# Patient Record
Sex: Female | Born: 1965 | Race: White | Hispanic: No | Smoking: Never smoker
Health system: Southern US, Community
[De-identification: ages and names within clinical notes are randomized; demographics above are authoritative.]

## PROBLEM LIST (undated history)

## (undated) DIAGNOSIS — Z87898 Personal history of other specified conditions: Secondary | ICD-10-CM

## (undated) DIAGNOSIS — N926 Irregular menstruation, unspecified: Secondary | ICD-10-CM

## (undated) DIAGNOSIS — R51 Headache: Secondary | ICD-10-CM

## (undated) DIAGNOSIS — F329 Major depressive disorder, single episode, unspecified: Secondary | ICD-10-CM

## (undated) DIAGNOSIS — S0300XA Dislocation of jaw, unspecified side, initial encounter: Secondary | ICD-10-CM

## (undated) DIAGNOSIS — N920 Excessive and frequent menstruation with regular cycle: Secondary | ICD-10-CM

## (undated) DIAGNOSIS — E785 Hyperlipidemia, unspecified: Secondary | ICD-10-CM

## (undated) DIAGNOSIS — R7303 Prediabetes: Secondary | ICD-10-CM

## (undated) DIAGNOSIS — M1990 Unspecified osteoarthritis, unspecified site: Secondary | ICD-10-CM

## (undated) DIAGNOSIS — H9190 Unspecified hearing loss, unspecified ear: Secondary | ICD-10-CM

## (undated) DIAGNOSIS — I872 Venous insufficiency (chronic) (peripheral): Secondary | ICD-10-CM

## (undated) DIAGNOSIS — F419 Anxiety disorder, unspecified: Secondary | ICD-10-CM

## (undated) DIAGNOSIS — I10 Essential (primary) hypertension: Secondary | ICD-10-CM

## (undated) DIAGNOSIS — R002 Palpitations: Secondary | ICD-10-CM

## (undated) DIAGNOSIS — G4700 Insomnia, unspecified: Secondary | ICD-10-CM

## (undated) DIAGNOSIS — F32A Depression, unspecified: Secondary | ICD-10-CM

## (undated) DIAGNOSIS — R609 Edema, unspecified: Secondary | ICD-10-CM

## (undated) DIAGNOSIS — R0600 Dyspnea, unspecified: Secondary | ICD-10-CM

## (undated) DIAGNOSIS — F319 Bipolar disorder, unspecified: Secondary | ICD-10-CM

## (undated) DIAGNOSIS — Z973 Presence of spectacles and contact lenses: Secondary | ICD-10-CM

## (undated) DIAGNOSIS — R1310 Dysphagia, unspecified: Secondary | ICD-10-CM

## (undated) DIAGNOSIS — I2699 Other pulmonary embolism without acute cor pulmonale: Secondary | ICD-10-CM

## (undated) DIAGNOSIS — I639 Cerebral infarction, unspecified: Secondary | ICD-10-CM

## (undated) DIAGNOSIS — R519 Headache, unspecified: Secondary | ICD-10-CM

## (undated) DIAGNOSIS — I499 Cardiac arrhythmia, unspecified: Secondary | ICD-10-CM

## (undated) DIAGNOSIS — F4310 Post-traumatic stress disorder, unspecified: Secondary | ICD-10-CM

## (undated) DIAGNOSIS — D6851 Activated protein C resistance: Secondary | ICD-10-CM

## (undated) DIAGNOSIS — R000 Tachycardia, unspecified: Secondary | ICD-10-CM

## (undated) DIAGNOSIS — I82409 Acute embolism and thrombosis of unspecified deep veins of unspecified lower extremity: Secondary | ICD-10-CM

## (undated) DIAGNOSIS — R32 Unspecified urinary incontinence: Secondary | ICD-10-CM

## (undated) DIAGNOSIS — K219 Gastro-esophageal reflux disease without esophagitis: Secondary | ICD-10-CM

## (undated) DIAGNOSIS — Z8719 Personal history of other diseases of the digestive system: Secondary | ICD-10-CM

## (undated) DIAGNOSIS — H7292 Unspecified perforation of tympanic membrane, left ear: Secondary | ICD-10-CM

## (undated) DIAGNOSIS — M352 Behcet's disease: Secondary | ICD-10-CM

## (undated) DIAGNOSIS — R61 Generalized hyperhidrosis: Secondary | ICD-10-CM

## (undated) DIAGNOSIS — Z87820 Personal history of traumatic brain injury: Secondary | ICD-10-CM

## (undated) HISTORY — PX: CHOLECYSTECTOMY: SHX55

## (undated) HISTORY — PX: EXPLORATORY LAPAROTOMY: SUR591

---

## 1998-06-05 ENCOUNTER — Ambulatory Visit (HOSPITAL_COMMUNITY): Admission: RE | Admit: 1998-06-05 | Discharge: 1998-06-05 | Payer: Self-pay | Admitting: Family Medicine

## 1998-06-05 ENCOUNTER — Encounter: Payer: Self-pay | Admitting: Family Medicine

## 1998-07-20 ENCOUNTER — Other Ambulatory Visit: Admission: RE | Admit: 1998-07-20 | Discharge: 1998-07-20 | Payer: Self-pay | Admitting: Family Medicine

## 1998-10-26 ENCOUNTER — Ambulatory Visit (HOSPITAL_COMMUNITY): Admission: RE | Admit: 1998-10-26 | Discharge: 1998-10-26 | Payer: Self-pay | Admitting: Obstetrics and Gynecology

## 1999-10-02 ENCOUNTER — Other Ambulatory Visit: Admission: RE | Admit: 1999-10-02 | Discharge: 1999-10-02 | Payer: Self-pay | Admitting: Obstetrics and Gynecology

## 1999-10-03 ENCOUNTER — Other Ambulatory Visit: Admission: RE | Admit: 1999-10-03 | Discharge: 1999-10-03 | Payer: Self-pay | Admitting: Obstetrics and Gynecology

## 1999-10-24 ENCOUNTER — Other Ambulatory Visit (HOSPITAL_COMMUNITY): Admission: RE | Admit: 1999-10-24 | Discharge: 1999-10-26 | Payer: Self-pay | Admitting: Psychiatry

## 2000-05-18 ENCOUNTER — Ambulatory Visit (HOSPITAL_COMMUNITY): Admission: RE | Admit: 2000-05-18 | Discharge: 2000-05-18 | Payer: Self-pay | Admitting: Specialist

## 2000-08-19 DIAGNOSIS — I639 Cerebral infarction, unspecified: Secondary | ICD-10-CM

## 2000-08-19 HISTORY — DX: Cerebral infarction, unspecified: I63.9

## 2018-03-19 DIAGNOSIS — I82409 Acute embolism and thrombosis of unspecified deep veins of unspecified lower extremity: Secondary | ICD-10-CM

## 2018-03-19 HISTORY — DX: Acute embolism and thrombosis of unspecified deep veins of unspecified lower extremity: I82.409

## 2018-04-10 DIAGNOSIS — I2699 Other pulmonary embolism without acute cor pulmonale: Secondary | ICD-10-CM

## 2018-04-10 HISTORY — DX: Other pulmonary embolism without acute cor pulmonale: I26.99

## 2018-08-04 ENCOUNTER — Other Ambulatory Visit: Payer: Self-pay | Admitting: Orthopedic Surgery

## 2018-08-06 NOTE — Patient Instructions (Addendum)
Your procedure is scheduled on: Monday, Dec. 23, 2019   Surgery Time:  11:15AM-12:41PM   Report to [facility] Main  Entrance    Report to admitting at 8:45 AM   Call this number if you have problems the morning of surgery [phone]   Do not eat food or drink liquids :After Midnight.   Brush your teeth the morning of surgery.   Do NOT smoke after Midnight   Take these medicines the morning of surgery with A SIP OF WATER: Diltiazem, Mybetriq, Pravastatin                               You may not have any metal on your body including hair pins, jewelry, and body piercings             Do not wear make-up, lotions, powders, perfumes/cologne, or deodorant             Do not wear nail polish.  Do not shave  48 hours prior to surgery.               Do not bring valuables to the hospital. [facility] IS NOT             RESPONSIBLE   FOR VALUABLES.   Contacts, dentures or bridgework may not be worn into surgery.   Leave suitcase in the car. After surgery it may be brought to your room.   Special Instructions: Bring a copy of your healthcare power of attorney and living will documents         the day of surgery if you haven't scanned them in before.              Please read over the following fact sheets you were given:  [facility] - Preparing for [facility] Before surgery, you can play an important role.  Because skin is not sterile, your skin needs to be as free of germs as possible.  You can reduce the number of germs on your skin by washing with CHG (chlorahexidine gluconate) soap before surgery.  CHG is an antiseptic cleaner which kills germs and bonds with the skin to continue killing germs even after washing. Please DO NOT use if you have an allergy to CHG or antibacterial soaps.  If your skin becomes reddened/irritated stop using the CHG and inform your nurse when you arrive at Short Stay. Do not shave (including legs and underarms) for at least 48 hours prior to the  first CHG shower.  You may shave your face/neck.  Please follow these instructions carefully:  1.  Shower with CHG Soap the night before surgery and the  morning of surgery.  2.  If you choose to wash your hair, wash your hair first as usual with your normal  shampoo.  3.  After you shampoo, rinse your hair and body thoroughly to remove the shampoo.                             4.  Use CHG as you would any other liquid soap.  You can apply chg directly to the skin and wash.  Gently with a scrungie or clean washcloth.  5.  Apply the CHG Soap to your body ONLY FROM THE NECK DOWN.   Do   not use on face/ open  Wound or open sores. Avoid contact with eyes, ears mouth and   genitals (private parts).                       Wash face,  Genitals (private parts) with your normal soap.             6.  Wash thoroughly, paying special attention to the area where your    surgery  will be performed.  7.  Thoroughly rinse your body with warm water from the neck down.  8.  DO NOT shower/wash with your normal soap after using and rinsing off the CHG Soap.                9.  Pat yourself dry with a clean towel.            10.  Wear clean pajamas.            11.  Place clean sheets on your bed the night of your first shower and do not  sleep with pets. Day of Surgery : Do not apply any lotions/deodorants the morning of surgery.  Please wear clean clothes to the hospital/surgery center.  FAILURE TO FOLLOW THESE INSTRUCTIONS MAY RESULT IN THE CANCELLATION OF YOUR SURGERY  PATIENT SIGNATURE_________________________________  NURSE SIGNATURE__________________________________  ________________________________________________________________________   Cathy Murray  An incentive spirometer is a tool that can help keep your lungs clear and active. This tool measures how well you are filling your lungs with each breath. Taking long deep breaths may help reverse or decrease the chance  of developing breathing (pulmonary) problems (especially infection) following:  A long period of time when you are unable to move or be active. BEFORE THE PROCEDURE   If the spirometer includes an indicator to show your best effort, your nurse or respiratory therapist will set it to a desired goal.  If possible, sit up straight or lean slightly forward. Try not to slouch.  Hold the incentive spirometer in an upright position. INSTRUCTIONS FOR USE  1. Sit on the edge of your bed if possible, or sit up as far as you can in bed or on a chair. 2. Hold the incentive spirometer in an upright position. 3. Breathe out normally. 4. Place the mouthpiece in your mouth and seal your lips tightly around it. 5. Breathe in slowly and as deeply as possible, raising the piston or the ball toward the top of the column. 6. Hold your breath for 3-5 seconds or for as long as possible. Allow the piston or ball to fall to the bottom of the column. 7. Remove the mouthpiece from your mouth and breathe out normally. 8. Rest for a few seconds and repeat Steps 1 through 7 at least 10 times every 1-2 hours when you are awake. Take your time and take a few normal breaths between deep breaths. 9. The spirometer may include an indicator to show your best effort. Use the indicator as a goal to work toward during each repetition. 10. After each set of 10 deep breaths, practice coughing to be sure your lungs are clear. If you have an incision (the cut made at the time of surgery), support your incision when coughing by placing a pillow or rolled up towels firmly against it. Once you are able to get out of bed, walk around indoors and cough well. You may stop using the incentive spirometer when instructed by your caregiver.  RISKS AND COMPLICATIONS  Take your time  so you do not get dizzy or light-headed.  If you are in pain, you may need to take or ask for pain medication before doing incentive spirometry. It is harder to  take a deep breath if you are having pain. AFTER USE  Rest and breathe slowly and easily.  It can be helpful to keep track of a log of your progress. Your caregiver can provide you with a simple table to help with this. If you are using the spirometer at home, follow these instructions: [place] IF:   You are having difficultly using the spirometer.  You have trouble using the spirometer as often as instructed.  Your pain medication is not giving enough relief while using the spirometer.  You develop fever of 100.5 F (38.1 C) or higher. SEEK IMMEDIATE MEDICAL CARE IF:   You cough up bloody sputum that had not been present before.  You develop fever of 102 F (38.9 C) or greater.  You develop worsening pain at or near the incision site. MAKE SURE YOU:   Understand these instructions.  Will watch your condition.  Will get help right away if you are not doing well or get worse. Document Released: 12/16/2006 Document Revised: 10/28/2011 Document Reviewed: 02/16/2007 [facility] Patient Information 2014 [place], [place].   ________________________________________________________________________

## 2018-08-07 ENCOUNTER — Encounter (HOSPITAL_COMMUNITY): Payer: Self-pay

## 2018-08-07 ENCOUNTER — Other Ambulatory Visit: Payer: Self-pay

## 2018-08-07 ENCOUNTER — Encounter (HOSPITAL_COMMUNITY)
Admission: RE | Admit: 2018-08-07 | Discharge: 2018-08-07 | Disposition: A | Discharge status: Home / Self Care | Payer: 59 | Source: Ambulatory Visit | Attending: Orthopedic Surgery | Admitting: Orthopedic Surgery

## 2018-08-07 DIAGNOSIS — Z01812 Encounter for preprocedural laboratory examination: Secondary | ICD-10-CM

## 2018-08-07 DIAGNOSIS — Z8673 Personal history of transient ischemic attack (TIA), and cerebral infarction without residual deficits: Secondary | ICD-10-CM | POA: Diagnosis not present

## 2018-08-07 DIAGNOSIS — Z86711 Personal history of pulmonary embolism: Secondary | ICD-10-CM | POA: Diagnosis not present

## 2018-08-07 DIAGNOSIS — Z888 Allergy status to other drugs, medicaments and biological substances status: Secondary | ICD-10-CM | POA: Diagnosis not present

## 2018-08-07 DIAGNOSIS — M1990 Unspecified osteoarthritis, unspecified site: Secondary | ICD-10-CM | POA: Diagnosis not present

## 2018-08-07 DIAGNOSIS — I872 Venous insufficiency (chronic) (peripheral): Secondary | ICD-10-CM | POA: Diagnosis not present

## 2018-08-07 DIAGNOSIS — Z9049 Acquired absence of other specified parts of digestive tract: Secondary | ICD-10-CM | POA: Diagnosis not present

## 2018-08-07 DIAGNOSIS — M7541 Impingement syndrome of right shoulder: Secondary | ICD-10-CM | POA: Diagnosis present

## 2018-08-07 DIAGNOSIS — I10 Essential (primary) hypertension: Secondary | ICD-10-CM | POA: Diagnosis not present

## 2018-08-07 DIAGNOSIS — Z7901 Long term (current) use of anticoagulants: Secondary | ICD-10-CM | POA: Diagnosis not present

## 2018-08-07 DIAGNOSIS — D6851 Activated protein C resistance: Secondary | ICD-10-CM | POA: Diagnosis not present

## 2018-08-07 DIAGNOSIS — F419 Anxiety disorder, unspecified: Secondary | ICD-10-CM | POA: Diagnosis not present

## 2018-08-07 DIAGNOSIS — G4700 Insomnia, unspecified: Secondary | ICD-10-CM | POA: Diagnosis not present

## 2018-08-07 DIAGNOSIS — F4310 Post-traumatic stress disorder, unspecified: Secondary | ICD-10-CM | POA: Diagnosis not present

## 2018-08-07 DIAGNOSIS — Z86718 Personal history of other venous thrombosis and embolism: Secondary | ICD-10-CM | POA: Diagnosis not present

## 2018-08-07 DIAGNOSIS — K219 Gastro-esophageal reflux disease without esophagitis: Secondary | ICD-10-CM | POA: Diagnosis not present

## 2018-08-07 DIAGNOSIS — M352 Behcet's disease: Secondary | ICD-10-CM | POA: Diagnosis not present

## 2018-08-07 DIAGNOSIS — E785 Hyperlipidemia, unspecified: Secondary | ICD-10-CM | POA: Diagnosis not present

## 2018-08-07 DIAGNOSIS — R7303 Prediabetes: Secondary | ICD-10-CM | POA: Diagnosis not present

## 2018-08-07 DIAGNOSIS — Z79899 Other long term (current) drug therapy: Secondary | ICD-10-CM | POA: Diagnosis not present

## 2018-08-07 DIAGNOSIS — F319 Bipolar disorder, unspecified: Secondary | ICD-10-CM | POA: Diagnosis not present

## 2018-08-07 HISTORY — DX: Bipolar disorder, unspecified: F31.9

## 2018-08-07 HISTORY — DX: Unspecified perforation of tympanic membrane, left ear: H72.92

## 2018-08-07 HISTORY — DX: Presence of spectacles and contact lenses: Z97.3

## 2018-08-07 HISTORY — DX: Prediabetes: R73.03

## 2018-08-07 HISTORY — DX: Headache: R51

## 2018-08-07 HISTORY — DX: Dislocation of jaw, unspecified side, initial encounter: S03.00XA

## 2018-08-07 HISTORY — DX: Personal history of other diseases of the digestive system: Z87.19

## 2018-08-07 HISTORY — DX: Dyspnea, unspecified: R06.00

## 2018-08-07 HISTORY — DX: Acute embolism and thrombosis of unspecified deep veins of unspecified lower extremity: I82.409

## 2018-08-07 HISTORY — DX: Essential (primary) hypertension: I10

## 2018-08-07 HISTORY — DX: Unspecified hearing loss, unspecified ear: H91.90

## 2018-08-07 HISTORY — DX: Hyperlipidemia, unspecified: E78.5

## 2018-08-07 HISTORY — DX: Depression, unspecified: F32.A

## 2018-08-07 HISTORY — DX: Venous insufficiency (chronic) (peripheral): I87.2

## 2018-08-07 HISTORY — DX: Activated protein C resistance: D68.51

## 2018-08-07 HISTORY — DX: Major depressive disorder, single episode, unspecified: F32.9

## 2018-08-07 HISTORY — DX: Headache, unspecified: R51.9

## 2018-08-07 HISTORY — DX: Unspecified osteoarthritis, unspecified site: M19.90

## 2018-08-07 HISTORY — DX: Cerebral infarction, unspecified: I63.9

## 2018-08-07 HISTORY — DX: Personal history of traumatic brain injury: Z87.820

## 2018-08-07 HISTORY — DX: Tachycardia, unspecified: R00.0

## 2018-08-07 HISTORY — DX: Post-traumatic stress disorder, unspecified: F43.10

## 2018-08-07 HISTORY — DX: Generalized hyperhidrosis: R61

## 2018-08-07 HISTORY — DX: Anxiety disorder, unspecified: F41.9

## 2018-08-07 HISTORY — DX: Dysphagia, unspecified: R13.10

## 2018-08-07 HISTORY — DX: Palpitations: R00.2

## 2018-08-07 HISTORY — DX: Unspecified urinary incontinence: R32

## 2018-08-07 HISTORY — DX: Behcet's disease: M35.2

## 2018-08-07 HISTORY — DX: Irregular menstruation, unspecified: N92.6

## 2018-08-07 HISTORY — DX: Edema, unspecified: R60.9

## 2018-08-07 HISTORY — DX: Personal history of other specified conditions: Z87.898

## 2018-08-07 HISTORY — DX: Gastro-esophageal reflux disease without esophagitis: K21.9

## 2018-08-07 HISTORY — DX: Excessive and frequent menstruation with regular cycle: N92.0

## 2018-08-07 HISTORY — DX: Other pulmonary embolism without acute cor pulmonale: I26.99

## 2018-08-07 HISTORY — DX: Insomnia, unspecified: G47.00

## 2018-08-07 LAB — BASIC METABOLIC PANEL
Anion gap: 8 (ref 5–15)
BUN: 17 mg/dL (ref 6–20)
CO2: 25 mmol/L (ref 22–32)
Calcium: 8.8 mg/dL — ABNORMAL LOW (ref 8.9–10.3)
Chloride: 105 mmol/L (ref 98–111)
Creatinine, Ser: 0.84 mg/dL (ref 0.44–1.00)
GFR calc Af Amer: >60 mL/min (ref >60)
GFR calc non Af Amer: >60 mL/min (ref >60)
GLUCOSE: 106 mg/dL — AB (ref 70–99)
Potassium: 4.3 mmol/L (ref 3.5–5.1)
Sodium: 138 mmol/L (ref 135–145)

## 2018-08-07 LAB — CBC
HCT: 36.7 % (ref 36.0–46.0)
Hemoglobin: 11.4 g/dL — ABNORMAL LOW (ref 12.0–15.0)
MCH: 28.4 pg (ref 26.0–34.0)
MCHC: 31.1 g/dL (ref 30.0–36.0)
MCV: 91.5 fL (ref 80.0–100.0)
Platelets: 290 10*3/uL (ref 150–400)
RBC: 4.01 MIL/uL (ref 3.87–5.11)
RDW: 12.6 % (ref 11.5–15.5)
WBC: 6.2 10*3/uL (ref 4.0–10.5)
nRBC: 0 % (ref 0.0–0.2)

## 2018-08-07 LAB — HEMOGLOBIN A1C
Hgb A1c MFr Bld: 5.8 % — ABNORMAL HIGH (ref 4.8–5.6)
Mean Plasma Glucose: 119.76 mg/dL

## 2018-08-07 NOTE — Pre-Procedure Instructions (Signed)
The following are in the hard chart:  Last office visit note 07/14/2018 A. Dein P.A. EKG 05/17/2018 ECHO report 04/11/2018 and 06/13/2017

## 2018-08-07 NOTE — Pre-Procedure Instructions (Signed)
CBC and BMP results 08/07/2018 sent to Dr. Sherlean FootLucey via epic

## 2018-08-10 ENCOUNTER — Encounter (HOSPITAL_COMMUNITY): Payer: Self-pay | Admitting: Emergency Medicine

## 2018-08-10 ENCOUNTER — Ambulatory Visit (HOSPITAL_COMMUNITY): Payer: 59 | Admitting: Certified Registered Nurse Anesthetist

## 2018-08-10 ENCOUNTER — Ambulatory Visit (HOSPITAL_COMMUNITY)
Admission: RE | Admit: 2018-08-10 | Discharge: 2018-08-10 | Disposition: A | Discharge status: Home / Self Care | Payer: 59 | Source: Ambulatory Visit | Attending: Orthopedic Surgery | Admitting: Orthopedic Surgery

## 2018-08-10 ENCOUNTER — Ambulatory Visit (HOSPITAL_COMMUNITY): Payer: 59

## 2018-08-10 ENCOUNTER — Encounter (HOSPITAL_COMMUNITY)
Admission: RE | Disposition: A | Discharge status: Home / Self Care | Payer: Self-pay | Source: Ambulatory Visit | Attending: Orthopedic Surgery

## 2018-08-10 DIAGNOSIS — Z8673 Personal history of transient ischemic attack (TIA), and cerebral infarction without residual deficits: Secondary | ICD-10-CM | POA: Insufficient documentation

## 2018-08-10 DIAGNOSIS — I872 Venous insufficiency (chronic) (peripheral): Secondary | ICD-10-CM | POA: Insufficient documentation

## 2018-08-10 DIAGNOSIS — R0602 Shortness of breath: Secondary | ICD-10-CM

## 2018-08-10 DIAGNOSIS — Z888 Allergy status to other drugs, medicaments and biological substances status: Secondary | ICD-10-CM | POA: Insufficient documentation

## 2018-08-10 DIAGNOSIS — Z79899 Other long term (current) drug therapy: Secondary | ICD-10-CM | POA: Insufficient documentation

## 2018-08-10 DIAGNOSIS — D6851 Activated protein C resistance: Secondary | ICD-10-CM | POA: Insufficient documentation

## 2018-08-10 DIAGNOSIS — Z9049 Acquired absence of other specified parts of digestive tract: Secondary | ICD-10-CM | POA: Insufficient documentation

## 2018-08-10 DIAGNOSIS — Z7901 Long term (current) use of anticoagulants: Secondary | ICD-10-CM | POA: Insufficient documentation

## 2018-08-10 DIAGNOSIS — F319 Bipolar disorder, unspecified: Secondary | ICD-10-CM | POA: Insufficient documentation

## 2018-08-10 DIAGNOSIS — M352 Behcet's disease: Secondary | ICD-10-CM | POA: Insufficient documentation

## 2018-08-10 DIAGNOSIS — Z86711 Personal history of pulmonary embolism: Secondary | ICD-10-CM | POA: Insufficient documentation

## 2018-08-10 DIAGNOSIS — I10 Essential (primary) hypertension: Secondary | ICD-10-CM | POA: Insufficient documentation

## 2018-08-10 DIAGNOSIS — M7541 Impingement syndrome of right shoulder: Secondary | ICD-10-CM | POA: Insufficient documentation

## 2018-08-10 DIAGNOSIS — K219 Gastro-esophageal reflux disease without esophagitis: Secondary | ICD-10-CM | POA: Insufficient documentation

## 2018-08-10 DIAGNOSIS — F4310 Post-traumatic stress disorder, unspecified: Secondary | ICD-10-CM | POA: Insufficient documentation

## 2018-08-10 DIAGNOSIS — E785 Hyperlipidemia, unspecified: Secondary | ICD-10-CM | POA: Insufficient documentation

## 2018-08-10 DIAGNOSIS — M1990 Unspecified osteoarthritis, unspecified site: Secondary | ICD-10-CM | POA: Insufficient documentation

## 2018-08-10 DIAGNOSIS — R7303 Prediabetes: Secondary | ICD-10-CM | POA: Insufficient documentation

## 2018-08-10 DIAGNOSIS — G4700 Insomnia, unspecified: Secondary | ICD-10-CM | POA: Insufficient documentation

## 2018-08-10 DIAGNOSIS — F419 Anxiety disorder, unspecified: Secondary | ICD-10-CM | POA: Insufficient documentation

## 2018-08-10 DIAGNOSIS — Z86718 Personal history of other venous thrombosis and embolism: Secondary | ICD-10-CM | POA: Insufficient documentation

## 2018-08-10 HISTORY — PX: EXAM UNDER ANESTHESIA WITH MANIPULATION OF SHOULDER: SHX5817

## 2018-08-10 LAB — PREGNANCY, URINE: PREG TEST UR: NEGATIVE

## 2018-08-10 SURGERY — EXAM UNDER ANESTHESIA, SHOULDER, WITH MANIPULATION
Anesthesia: Regional | Site: Shoulder | Laterality: Right

## 2018-08-10 MED ORDER — FENTANYL CITRATE (PF) 250 MCG/5ML IJ SOLN
INTRAMUSCULAR | Status: AC
  Filled 2018-08-10: qty 5

## 2018-08-10 MED ORDER — BUPIVACAINE LIPOSOME 1.3 % IJ SUSP
INTRAMUSCULAR | Status: DC | PRN
  Administered 2018-08-10: 10 mL via PERINEURAL

## 2018-08-10 MED ORDER — OXYCODONE HCL 5 MG PO TABS: 5.0000 mg | ORAL_TABLET | ORAL | 0 refills | Status: DC | PRN

## 2018-08-10 MED ORDER — ROCURONIUM BROMIDE 100 MG/10ML IV SOLN
INTRAVENOUS | Status: DC | PRN
  Administered 2018-08-10: 20 mg via INTRAVENOUS
  Administered 2018-08-10: 50 mg via INTRAVENOUS

## 2018-08-10 MED ORDER — DEXAMETHASONE SODIUM PHOSPHATE 10 MG/ML IJ SOLN
INTRAMUSCULAR | Status: DC | PRN
  Administered 2018-08-10: 10 mg via INTRAVENOUS

## 2018-08-10 MED ORDER — ONDANSETRON HCL 4 MG/2ML IJ SOLN
INTRAMUSCULAR | Status: AC
  Filled 2018-08-10: qty 2

## 2018-08-10 MED ORDER — MIDAZOLAM HCL 2 MG/2ML IJ SOLN
INTRAMUSCULAR | Status: AC
  Filled 2018-08-10: qty 2

## 2018-08-10 MED ORDER — LIDOCAINE HCL (CARDIAC) PF 100 MG/5ML IV SOSY
PREFILLED_SYRINGE | INTRAVENOUS | Status: DC | PRN
  Administered 2018-08-10: 40 mg via INTRAVENOUS

## 2018-08-10 MED ORDER — MIDAZOLAM HCL 5 MG/5ML IJ SOLN
INTRAMUSCULAR | Status: DC | PRN
  Administered 2018-08-10 (×2): 1 mg via INTRAVENOUS

## 2018-08-10 MED ORDER — PROPOFOL 10 MG/ML IV BOLUS
INTRAVENOUS | Status: AC
  Filled 2018-08-10: qty 20

## 2018-08-10 MED ORDER — BUPIVACAINE HCL (PF) 0.5 % IJ SOLN
INTRAMUSCULAR | Status: DC | PRN
  Administered 2018-08-10: 15 mL via PERINEURAL

## 2018-08-10 MED ORDER — SUGAMMADEX SODIUM 500 MG/5ML IV SOLN
INTRAVENOUS | Status: AC
  Filled 2018-08-10: qty 5

## 2018-08-10 MED ORDER — PROPOFOL 10 MG/ML IV BOLUS
INTRAVENOUS | Status: DC | PRN
  Administered 2018-08-10: 150 mg via INTRAVENOUS
  Administered 2018-08-10: 25 mg via INTRAVENOUS

## 2018-08-10 MED ORDER — STERILE WATER FOR INJECTION IJ SOLN
INTRAMUSCULAR | Status: DC | PRN
  Administered 2018-08-10: 500 mL

## 2018-08-10 MED ORDER — FENTANYL CITRATE (PF) 100 MCG/2ML IJ SOLN: 25.0000 ug | INTRAMUSCULAR | Status: DC | PRN

## 2018-08-10 MED ORDER — FENTANYL CITRATE (PF) 100 MCG/2ML IJ SOLN
INTRAMUSCULAR | Status: DC | PRN
  Administered 2018-08-10: 50 ug via INTRAVENOUS

## 2018-08-10 MED ORDER — MIDAZOLAM HCL 2 MG/2ML IJ SOLN
1.0000 mg | INTRAMUSCULAR | Status: DC
  Administered 2018-08-10: 2 mg via INTRAVENOUS
  Filled 2018-08-10: qty 2

## 2018-08-10 MED ORDER — LACTATED RINGERS IV SOLN
INTRAVENOUS | Status: DC
  Administered 2018-08-10 (×2): via INTRAVENOUS

## 2018-08-10 MED ORDER — FENTANYL CITRATE (PF) 100 MCG/2ML IJ SOLN
50.0000 ug | INTRAMUSCULAR | Status: DC
  Filled 2018-08-10: qty 2

## 2018-08-10 MED ORDER — ONDANSETRON HCL 4 MG/2ML IJ SOLN
INTRAMUSCULAR | Status: DC | PRN
  Administered 2018-08-10: 4 mg via INTRAVENOUS

## 2018-08-10 MED ORDER — CHLORHEXIDINE GLUCONATE 4 % EX LIQD: 60.0000 mL | Freq: Once | CUTANEOUS | Status: DC

## 2018-08-10 MED ORDER — SUGAMMADEX SODIUM 500 MG/5ML IV SOLN
INTRAVENOUS | Status: DC | PRN
  Administered 2018-08-10: 300 mg via INTRAVENOUS

## 2018-08-10 MED ORDER — PHENYLEPHRINE HCL 10 MG/ML IJ SOLN
INTRAMUSCULAR | Status: DC | PRN
  Administered 2018-08-10: 40 ug via INTRAVENOUS
  Administered 2018-08-10: 120 ug via INTRAVENOUS
  Administered 2018-08-10: 40 ug via INTRAVENOUS
  Administered 2018-08-10: 80 ug via INTRAVENOUS

## 2018-08-10 SURGICAL SUPPLY — 24 items
BLADE GREAT WHITE 4.2 (BLADE) ×1 IMPLANT
BLADE SURG SZ11 CARB STEEL (BLADE) ×1 IMPLANT
CANNULA 5.75X7 CRYSTAL CLEAR (CANNULA) ×1 IMPLANT
COVER SURGICAL LIGHT HANDLE (MISCELLANEOUS) ×3 IMPLANT
COVER WAND RF STERILE (DRAPES) ×1 IMPLANT
DRAPE SHOULDER BEACH CHAIR (DRAPES) ×3 IMPLANT
DRSG PAD ABDOMINAL 8X10 ST (GAUZE/BANDAGES/DRESSINGS) ×3 IMPLANT
GAUZE XEROFORM 1X8 LF (GAUZE/BANDAGES/DRESSINGS) ×2 IMPLANT
GLOVE BIOGEL PI IND STRL 7.5 (GLOVE) ×3 IMPLANT
GLOVE BIOGEL PI IND STRL 8 (GLOVE) ×2 IMPLANT
GLOVE BIOGEL PI INDICATOR 7.5 (GLOVE) ×3
GLOVE BIOGEL PI INDICATOR 8 (GLOVE) ×2
GLOVE SURG ORTHO 8.0 STRL STRW (GLOVE) ×4 IMPLANT
GOWN STRL REUS W/TWL XL LVL3 (GOWN DISPOSABLE) ×4 IMPLANT
KIT BASIN OR (CUSTOM PROCEDURE TRAY) ×1 IMPLANT
MANIFOLD NEPTUNE II (INSTRUMENTS) ×3 IMPLANT
PACK SHOULDER (CUSTOM PROCEDURE TRAY) ×1 IMPLANT
PROBE BIPOLAR ATHRO 135MM 90D (MISCELLANEOUS) ×1 IMPLANT
PROTECTOR NERVE ULNAR (MISCELLANEOUS) ×3 IMPLANT
SLING ARM XL FOAM STRAP (SOFTGOODS) ×2 IMPLANT
SUT ETHILON 4 0 (SUTURE) ×1 IMPLANT
TAPE CLOTH SURG 6X10 WHT LF (GAUZE/BANDAGES/DRESSINGS) ×1 IMPLANT
TOWEL OR 17X26 10 PK STRL BLUE (TOWEL DISPOSABLE) ×1 IMPLANT
TUBING ARTHRO INFLOW-ONLY STRL (TUBING) ×2 IMPLANT

## 2018-08-10 NOTE — Anesthesia Procedure Notes (Signed)
Procedure Name: Intubation Performed by: Freddrick March, MD Pre-anesthesia Checklist: Patient identified, Emergency Drugs available, Suction available, Patient being monitored and Timeout performed Patient Re-evaluated:Patient Re-evaluated prior to induction Oxygen Delivery Method: Circle system utilized Preoxygenation: Pre-oxygenation with 100% oxygen Induction Type: IV induction Ventilation: Mask ventilation without difficulty Laryngoscope Size: Glidescope and 4 Grade View: Grade IV Tube type: Oral Tube size: 7.0 mm Number of attempts: 1 Airway Equipment and Method: Stylet,  Oral airway and Video-laryngoscopy Placement Confirmation: ETT inserted through vocal cords under direct vision,  positive ETCO2 and breath sounds checked- equal and bilateral Secured at: 22 cm Tube secured with: Tape Dental Injury: Teeth and Oropharynx as per pre-operative assessment  Difficulty Due To: Difficulty was anticipated, Difficult Airway- due to large tongue, Difficult Airway- due to reduced neck mobility and Difficult Airway- due to limited oral opening Future Recommendations: Recommend- induction with short-acting agent, and alternative techniques readily available Comments: First look plain mac 3  Unable to visualize cords rigid larynx, poor opening.  Second attempt glidescope, intubated but cuff ruptured on teeth, reintubated over tube changer

## 2018-08-10 NOTE — Progress Notes (Signed)
Assisted Dr. Woodrum with right, ultrasound guided, adductor canal block. Side rails up, monitors on throughout procedure. See vital signs in flow sheet. Tolerated Procedure well.  

## 2018-08-10 NOTE — H&P (Signed)
Cathy MinersLeslie C Stofer MRN:  [id number] DOB/SEX:  09-24-1965/female  CHIEF COMPLAINT:  Painful right shoulder  HISTORY: Patient is a 52 y.o. female presented with a history of pain in the right shoulder. Onset of symptoms was gradual starting a few months ago with gradually worsening course since that time. Patient has been treated conservatively with over-the-counter NSAIDs and activity modification. Patient currently rates pain in the shoulder at 10 out of 10 with activity. There is pain at night.  PAST MEDICAL HISTORY: There are no active problems to display for this patient.  Past Medical History:  Diagnosis Date  . Anxiety   . Behcet's disease (HCC)   . Bipolar disorder (HCC)   . Dependent edema    Legs and feet  . Depression   . DVT (deep venous thrombosis) (HCC) 03/2018   Left leg  . Dyspnea    with exertion  . Factor V Leiden mutation (HCC)   . GERD (gastroesophageal reflux disease)   . Headache    Generalized  . Hearing loss   . Heart palpitations   . Heavy menstrual bleeding   . History of concussion   . History of dizziness   . History of gallstones   . Hyperlipidemia   . Hypertension   . Insomnia   . Irregular uterine bleeding   . Night sweats   . OA (osteoarthritis)   . PE (pulmonary thromboembolism) (HCC) 04/10/2018  . Pre-diabetes   . PTSD (post-traumatic stress disorder)   . Ruptured ear drum, left   . Sinus tachycardia   . Stroke [facility](HCC) 2002   balance and short term memory issues   . TMJ (dislocation of temporomandibular joint)   . Trouble swallowing   . Urine incontinence   . Venous insufficiency   . Wears glasses    Past Surgical History:  Procedure Laterality Date  . CESAREAN SECTION    . CHOLECYSTECTOMY    . EXPLORATORY LAPAROTOMY  1990's     MEDICATIONS:   Medications Prior to Admission  Medication Sig Dispense Refill Last Dose  . diltiazem (DILACOR XR) 120 MG 24 hr capsule Take 120 mg by mouth daily.     . lansoprazole (PREVACID) 30 MG  capsule Take 30 mg by mouth daily at 12 noon.     . mirabegron ER (MYRBETRIQ) 50 MG TB24 tablet Take 50 mg by mouth daily.     Marland Kitchen. OVER THE COUNTER MEDICATION Take 2 capsules by mouth daily. Amberen: menopause relief     . pravastatin (PRAVACHOL) 40 MG tablet Take 40 mg by mouth daily.     . rivaroxaban (XARELTO) 20 MG TABS tablet Take 20 mg by mouth daily with supper.     . ziprasidone (GEODON) 40 MG capsule Take 40 mg by mouth daily.       ALLERGIES:   Allergies  Allergen Reactions  . Adhesive [Tape] Itching    REVIEW OF SYSTEMS:  A comprehensive review of systems was negative except for: Musculoskeletal: positive for bone pain and muscle weakness   FAMILY HISTORY:  No family history on file.  SOCIAL HISTORY:   Social History   Tobacco Use  . Smoking status: Never Smoker  . Smokeless tobacco: Never Used  Substance Use Topics  . Alcohol use: Yes    Frequency: Never    Comment: rare     EXAMINATION:  Vital signs in last 24 hours: Temp:  [97.3 F (36.3 C)] 97.3 F (36.3 C) (12/23 0858) Pulse Rate:  [86] 86 (12/23 0858)  Resp:  [14] 14 (12/23 0858) BP: (128)/(83) 128/83 (12/23 0858) SpO2:  [99 %] 99 % (12/23 0858)  BP 128/83   Pulse 86   Temp (!) 97.3 F (36.3 C) (Oral)   Resp 14   LMP 08/05/2018 (Exact Date)   SpO2 99%   General Appearance:    Alert, cooperative, no distress, appears stated age  Head:    Normocephalic, without obvious abnormality, atraumatic  Eyes:    PERRL, conjunctiva/corneas clear, EOM's intact, fundi    benign, both eyes  Ears:    Normal TM's and external ear canals, both ears  Nose:   Nares normal, septum midline, mucosa normal, no drainage    or sinus tenderness  Throat:   Lips, mucosa, and tongue normal; teeth and gums normal  Neck:   Supple, symmetrical, trachea midline, no adenopathy;    thyroid:  no enlargement/tenderness/nodules; no carotid   bruit or JVD  Back:     Symmetric, no curvature, ROM normal, no CVA tenderness  Lungs:      Clear to auscultation bilaterally, respirations unlabored  Chest Wall:    No tenderness or deformity   Heart:    Regular rate and rhythm, S1 and S2 normal, no murmur, rub   or gallop  Breast Exam:    No tenderness, masses, or nipple abnormality  Abdomen:     Soft, non-tender, bowel sounds active all four quadrants,    no masses, no organomegaly  Genitalia:    Normal female without lesion, discharge or tenderness  Rectal:    Normal tone, no masses or tenderness;   guaiac negative stool  Extremities:   Extremities normal, atraumatic, no cyanosis or edema  Pulses:   2+ and symmetric all extremities  Skin:   Skin color, texture, turgor normal, no rashes or lesions  Lymph nodes:   Cervical, supraclavicular, and axillary nodes normal  Neurologic:   CNII-XII intact, normal strength, sensation and reflexes    throughout    Musculoskeletal:  ROM decreased, Ligaments intact,  Imaging Review Plain radiographs demonstrate mild degenerative joint disease of the right shoulder. The overall alignment is neutral. The bone quality appears to be good for age and reported activity level.  Assessment/Plan: Impingement syndrome right shoulder, adhesive capsulitis   The patient history, physical examination and imaging studies are consistent with impingement and adhesive capsulitis of the right shoulder. The patient has failed conservative treatment.  The clearance notes were reviewed.  After discussion with the patient it was felt that shoulder arthroscopy with manipulation was indicated. The procedure,  risks, and benefits of the procedure were presented and reviewed. The patient acknowledged the explanation, agreed to proceed with the plan.  Guy SandiferColby Alan Robbins 08/10/2018, 9:16 AM

## 2018-08-10 NOTE — Anesthesia Preprocedure Evaluation (Addendum)
Anesthesia Evaluation  Patient identified by MRN, date of birth, ID band Patient awake    Reviewed: Allergy & Precautions, NPO status , Patient's Chart, lab work & pertinent test results  Airway Mallampati: III  TM Distance: >3 FB Neck ROM: Full  Mouth opening: Limited Mouth Opening  Dental no notable dental hx. (+) Teeth Intact, Dental Advisory Given   Pulmonary sleep apnea (has not has sleep study yet) , PE   Pulmonary exam normal breath sounds clear to auscultation       Cardiovascular hypertension, negative cardio ROS Normal cardiovascular exam Rhythm:Regular Rate:Normal     Neuro/Psych PSYCHIATRIC DISORDERS Anxiety Depression Bipolar Disorder CVA (2002), Residual Symptoms    GI/Hepatic Neg liver ROS, GERD  Medicated,  Endo/Other  negative endocrine ROS  Renal/GU negative Renal ROS  negative genitourinary   Musculoskeletal  (+) Arthritis , Osteoarthritis,    Abdominal   Peds  Hematology  (+) Blood dyscrasia, , Factor V Leiden   Anesthesia Other Findings Right shoulder impingement syndrome  On xarelto, last dose Friday 08/07/18  Reproductive/Obstetrics                            Anesthesia Physical Anesthesia Plan  ASA: III  Anesthesia Plan: General and Regional   Post-op Pain Management:  Regional for Post-op pain   Induction: Intravenous  PONV Risk Score and Plan: 3 and Ondansetron, Dexamethasone and Midazolam  Airway Management Planned: Oral ETT  Additional Equipment:   Intra-op Plan:   Post-operative Plan: Extubation in OR  Informed Consent: I have reviewed the patients History and Physical, chart, labs and discussed the procedure including the risks, benefits and alternatives for the proposed anesthesia with the patient or authorized representative who has indicated his/her understanding and acceptance.   Dental advisory given  Plan Discussed with: CRNA  Anesthesia  Plan Comments:         Anesthesia Quick Evaluation

## 2018-08-10 NOTE — Anesthesia Postprocedure Evaluation (Signed)
Anesthesia Post Note  Patient: Cathy Murray  Procedure(s) Performed: EXAM UNDER ANESTHESIA WITH MANIPULATION OF SHOULDER (Right ) RIGHT SHOULDER ARTHROSCOPY WITH SUBACROMIAL DECOMPRESSION MANIPULATION (Right Shoulder)     Patient location during evaluation: PACU Anesthesia Type: Regional and General Level of consciousness: awake and alert Pain management: pain level controlled Vital Signs Assessment: post-procedure vital signs reviewed and stable Respiratory status: spontaneous breathing, nonlabored ventilation, respiratory function stable and patient connected to nasal cannula oxygen Cardiovascular status: blood pressure returned to baseline and stable Postop Assessment: no apparent nausea or vomiting Anesthetic complications: no    Last Vitals:  Vitals:   08/10/18 1545 08/10/18 1615  BP: 119/72 131/80  Pulse: 88 85  Resp: 18 17  Temp: 36.6 C 36.6 C  SpO2: 94% 93%    Last Pain:  Vitals:   08/10/18 1615  TempSrc:   PainSc: 0-No pain                 Ezariah Nace L Jassen Sarver

## 2018-08-10 NOTE — Anesthesia Procedure Notes (Signed)
Anesthesia Regional Block: Interscalene brachial plexus block   Pre-Anesthetic Checklist: ,, timeout performed, Correct Patient, Correct Site, Correct Laterality, Correct Procedure, Correct Position, site marked, Risks and benefits discussed,  Surgical consent,  Pre-op evaluation,  At surgeon's request and post-op pain management  Laterality: Right  Prep: Maximum Sterile Barrier Precautions used, chloraprep       Needles:  Injection technique: Single-shot  Needle Type: Echogenic Stimulator Needle     Needle Length: 9cm  Needle Gauge: 22     Additional Needles:   Procedures:,,,, ultrasound used (permanent image in chart),,,,  Narrative:  Start time: 08/10/2018 11:52 AM End time: 08/10/2018 12:02 PM Injection made incrementally with aspirations every 5 mL.  Performed by: Personally  Anesthesiologist: Elmer PickerWoodrum, Vear Staton L, MD  Additional Notes: Monitors applied. No increased pain on injection. No increased resistance to injection. Injection made in 5cc increments. Good needle visualization. Patient tolerated procedure well.

## 2018-08-10 NOTE — Transfer of Care (Signed)
Immediate Anesthesia Transfer of Care Note  Patient: Cathy Murray  Procedure(s) Performed: EXAM UNDER ANESTHESIA WITH MANIPULATION OF SHOULDER (Right ) RIGHT SHOULDER ARTHROSCOPY WITH SUBACROMIAL DECOMPRESSION MANIPULATION (Right Shoulder)  Patient Location: PACU  Anesthesia Type:General  Level of Consciousness: awake, alert , oriented and patient cooperative  Airway & Oxygen Therapy: Patient Spontanous Breathing and Patient connected to face mask oxygen  Post-op Assessment: Report given to RN, Post -op Vital signs reviewed and stable and Patient moving all extremities  Post vital signs: Reviewed and stable  Last Vitals:  Vitals Value Taken Time  BP 87/71 08/10/2018  1:48 PM  Temp 36.4 C 08/10/2018  1:48 PM  Pulse 88 08/10/2018  1:59 PM  Resp 22 08/10/2018  1:59 PM  SpO2 97 % 08/10/2018  1:59 PM  Vitals shown include unvalidated device data.  Last Pain:  Vitals:   08/10/18 1348  TempSrc:   PainSc: 0-No pain      Patients Stated Pain Goal: 4 (08/10/18 [id number])  Complications: No apparent anesthesia complications

## 2018-08-10 NOTE — OR Nursing (Signed)
Patient has a tape allergy. MD notified and requested Medipore tape (REF [id number]) to use for wound dressing.

## 2018-08-11 NOTE — Op Note (Signed)
TOTAL KNEE REPLACEMENT OPERATIVE NOTE:  08/10/2018  6:56 AM  PATIENT:  Cathy Murray  52 y.o. female  PRE-OPERATIVE DIAGNOSIS:  RT. IMPINGEMENT SYNDROME,  POST-OPERATIVE DIAGNOSIS:  RT. IMPINGEMENT SYNDROME  PROCEDURE:  Procedure(s): EXAM UNDER ANESTHESIA WITH MANIPULATION OF SHOULDER RIGHT SHOULDER ARTHROSCOPY WITH SUBACROMIAL DECOMPRESSION MANIPULATION  SURGEON:  Surgeon(s): Dannielle HuhLucey, Alexxander Kurt, MD  PHYSICIAN ASSISTANT: Skip MayerBlair Roberts, PA-C  ANESTHESIA:   regional and general  SPECIMEN: None  COUNTS:  Correct  TOURNIQUET:  * No tourniquets in log *  DICTATION:  Indication for procedure:    The patient is a 52 y.o. female who has failed conservative treatment for RT. IMPINGEMENT SYNDROME,.  Informed consent was obtained prior to anesthesia. The risks versus benefits of the operation were explain and in a way the patient can, and did, understand.   Description of procedure:     The patient was taken to the operating room and placed under anesthesia.  The patient was positioned in the usual fashion taking care that all body parts were adequately padded and/or protected.  A manipulation was carried out.  She was very tight but we regained 95% of normal ROM.  After sterile prep and drape, I made a posterior portal and did a diagnostic arthroscopy.  There was no arthritis.  There was the raw scar tissue which I debrided after making an anterior portal.  There was no other pathology within the glenohumeral joint.  I then redirected the scope into the subacromial space.  I made a direct lateral portal and performed a bursectomy, of which there was a lot.  I then performed a CA ligament release with the arthrocare and cleaned off the undersurface of the acromium and distal clavicle.  I used a 4.250mm cylindrical burr to perform an aggressive acriomioplasty and distal clavicle resection. I removed approximately 1cm of each.  This afforded a lot of space.  I then completed the debridement  of the bursa off the cuff and lavaged and closed with 4-0 nylons in the portals.  I dressed with xeroform, dressing sponges, ABD's and silk tape and applied a sling.  The patient was then awakened, extubated, and taken to the recovery room in stable condition.  COMPLICATIONS:  None.  PLAN OF CARE: Discharge to home after PACU  PATIENT DISPOSITION:  PACU - hemodynamically stable.   Delay start of Pharmacological VTE agent (>24hrs) due to surgical blood loss or risk of bleeding:  not applicable  Please fax a copy of this op note to my office at [phone] (please only include page 1 and 2 of the Case Information op note)

## 2018-08-13 ENCOUNTER — Encounter (HOSPITAL_COMMUNITY): Payer: Self-pay | Admitting: Orthopedic Surgery

## 2019-07-12 ENCOUNTER — Other Ambulatory Visit: Payer: Self-pay | Admitting: Orthopedic Surgery

## 2019-07-13 NOTE — Patient Instructions (Addendum)
DUE TO COVID-19 ONLY ONE VISITOR IS ALLOWED TO COME WITH YOU AND STAY IN THE WAITING ROOM ONLY DURING PRE OP AND PROCEDURE DAY OF SURGERY. THE 1 VISITOR MAY VISIT WITH YOU AFTER SURGERY IN YOUR PRIVATE ROOM DURING VISITING HOURS ONLY!  YOU NEED TO HAVE A COVID 19 TEST ON__11/27_____ :20_____, THIS TEST MUST BE DONE BEFORE SURGERY, COME  [address], [place] , [address].  [facility]) ONCE YOUR COVID TEST IS COMPLETED, PLEASE BEGIN THE QUARANTINE INSTRUCTIONS AS OUTLINED IN YOUR HANDOUT.                [place]   Your procedure is scheduled on: 07/19/19   Report to [facility] Main  Entrance   Report to admitting at   9:24 AM     Call this number if you have problems the morning of surgery [phone]     BRUSH YOUR TEETH MORNING OF SURGERY AND RINSE YOUR MOUTH OUT, NO CHEWING GUM CANDY OR MINTS.   Do not eat food After Midnight.   YOU MAY HAVE CLEAR LIQUIDS FROM MIDNIGHT UNTIL 8:15 AM.    CLEAR LIQUID DIET   Foods Allowed                                                                     Foods Excluded  Coffee and tea, regular and decaf                             liquids that you cannot  Plain Jell-O any favor except red or purple                                           see through such as: Fruit ices (not with fruit pulp)                                     milk, soups, orange juice  Iced Popsicles                                    All solid food Carbonated beverages, regular and diet                                    Cranberry, grape and apple juices Sports drinks like Gatorade Lightly seasoned clear broth or consume(fat free) Sugar, honey syrup      At 8:15 AM Please finish the prescribed Pre-Surgery drink  . Nothing by mouth after you finish the drink !   Take these medicines the morning of surgery with A SIP OF WATER: Gabapentin,Tiadylt ER, Prevacid, Mirabegron              Bring mask and tubing to hospital with  you.  You may not have any metal on your body including hair pins and              piercings  Do not wear jewelry, make-up, lotions, powders or perfumes, deodorant             Do not wear nail polish on your fingernails.              Do not shave  48 hours prior to surgery.                Do not bring valuables to the hospital. [place].  Contacts, dentures or bridgework may not be worn into surgery.      Patients discharged the day of surgery will not be allowed to drive home.   IF YOU ARE HAVING SURGERY AND GOING HOME THE SAME DAY, YOU MUST HAVE AN ADULT TO DRIVE YOU HOME AND BE WITH YOU FOR 24 HOURS.   YOU MAY GO HOME BY TAXI OR UBER OR ORTHERWISE, BUT AN ADULT MUST ACCOMPANY YOU HOME AND STAY WITH YOU FOR 24 HOURS.  Name and phone number of your driver:  Special Instructions: N/A              Please read over the following fact sheets you were given: _____________________________________________________________________             [facility] - Preparing for Surgery  Before surgery, you can play an important role.   Because skin is not sterile, your skin needs to be as free of germs as possible.   You can reduce the number of germs on your skin by washing with CHG (chlorahexidine gluconate) soap before surgery.   CHG is an antiseptic cleaner which kills germs and bonds with the skin to continue killing germs even after washing. Please DO NOT use if you have an allergy to CHG or antibacterial soaps.   If your skin becomes reddened/irritated stop using the CHG and inform your nurse when you arrive at Short Stay. Do not shave (including legs and underarms) for at least 48 hours prior to the first CHG shower.    Please follow these instructions carefully:  1.  Shower with CHG Soap the night before surgery and the  morning of Surgery.  2.  If you choose to wash your hair, wash your hair first as  usual with your  normal  shampoo.  3.  After you shampoo, rinse your hair and body thoroughly to remove the  shampoo.                                        4.  Use CHG as you would any other liquid soap.  You can apply chg directly  to the skin and wash                       Gently with a scrungie or clean washcloth.  5.  Apply the CHG Soap to your body ONLY FROM THE NECK DOWN.   Do not use on face/ open                           Wound or open sores. Avoid contact with eyes, ears mouth and  genitals (private parts).                       Wash face,  Genitals (private parts) with your normal soap.             6.  Wash thoroughly, paying special attention to the area where your surgery  will be performed.  7.  Thoroughly rinse your body with warm water from the neck down.  8.  DO NOT shower/wash with your normal soap after using and rinsing off  the CHG Soap.             9.  Pat yourself dry with a clean towel.            10.  Wear clean pajamas.            11.  Place clean sheets on your bed the night of your first shower and do not  sleep with pets. Day of Surgery : Do not apply any lotions/deodorants the morning of surgery.  Please wear clean clothes to the hospital/surgery center.  FAILURE TO FOLLOW THESE INSTRUCTIONS MAY RESULT IN THE CANCELLATION OF YOUR SURGERY PATIENT SIGNATURE_________________________________  NURSE SIGNATURE__________________________________  ________________________________________________________________________   Rogelia Mire  An incentive spirometer is a tool that can help keep your lungs clear and active. This tool measures how well you are filling your lungs with each breath. Taking long deep breaths may help reverse or decrease the chance of developing breathing (pulmonary) problems (especially infection) following:  A long period of time when you are unable to move or be active. BEFORE THE PROCEDURE   If the spirometer includes an indicator to  show your best effort, your nurse or respiratory therapist will set it to a desired goal.  If possible, sit up straight or lean slightly forward. Try not to slouch.  Hold the incentive spirometer in an upright position. INSTRUCTIONS FOR USE  1. Sit on the edge of your bed if possible, or sit up as far as you can in bed or on a chair. 2. Hold the incentive spirometer in an upright position. 3. Breathe out normally. 4. Place the mouthpiece in your mouth and seal your lips tightly around it. 5. Breathe in slowly and as deeply as possible, raising the piston or the ball toward the top of the column. 6. Hold your breath for 3-5 seconds or for as long as possible. Allow the piston or ball to fall to the bottom of the column. 7. Remove the mouthpiece from your mouth and breathe out normally. 8. Rest for a few seconds and repeat Steps 1 through 7 at least 10 times every 1-2 hours when you are awake. Take your time and take a few normal breaths between deep breaths. 9. The spirometer may include an indicator to show your best effort. Use the indicator as a goal to work toward during each repetition. 10. After each set of 10 deep breaths, practice coughing to be sure your lungs are clear. If you have an incision (the cut made at the time of surgery), support your incision when coughing by placing a pillow or rolled up towels firmly against it. Once you are able to get out of bed, walk around indoors and cough well. You may stop using the incentive spirometer when instructed by your caregiver.  RISKS AND COMPLICATIONS  Take your time so you do not get dizzy or light-headed.  If you are in pain, you may need to take  or ask for pain medication before doing incentive spirometry. It is harder to take a deep breath if you are having pain. AFTER USE  Rest and breathe slowly and easily.  It can be helpful to keep track of a log of your progress. Your caregiver can provide you with a simple table to help with  this. If you are using the spirometer at home, follow these instructions: SEEK MEDICAL CARE IF:   You are having difficultly using the spirometer.  You have trouble using the spirometer as often as instructed.  Your pain medication is not giving enough relief while using the spirometer.  You develop fever of 100.5 F (38.1 C) or higher. SEEK IMMEDIATE MEDICAL CARE IF:   You cough up bloody sputum that had not been present before.  You develop fever of 102 F (38.9 C) or greater.  You develop worsening pain at or near the incision site. MAKE SURE YOU:   Understand these instructions.  Will watch your condition.  Will get help right away if you are not doing well or get worse. Document Released: 12/16/2006 Document Revised: 10/28/2011 Document Reviewed: 02/16/2007 [facility] Patient Information 2014 [place], [place].   ________________________________________________________________________

## 2019-07-14 ENCOUNTER — Encounter (HOSPITAL_COMMUNITY)
Admission: RE | Admit: 2019-07-14 | Discharge: 2019-07-14 | Disposition: A | Discharge status: Home / Self Care | Payer: 59 | Source: Ambulatory Visit | Attending: Orthopedic Surgery | Admitting: Orthopedic Surgery

## 2019-07-14 ENCOUNTER — Other Ambulatory Visit: Payer: Self-pay

## 2019-07-14 ENCOUNTER — Encounter (HOSPITAL_COMMUNITY): Payer: Self-pay

## 2019-07-14 DIAGNOSIS — Z86718 Personal history of other venous thrombosis and embolism: Secondary | ICD-10-CM | POA: Diagnosis not present

## 2019-07-14 DIAGNOSIS — Z7901 Long term (current) use of anticoagulants: Secondary | ICD-10-CM | POA: Diagnosis not present

## 2019-07-14 DIAGNOSIS — I10 Essential (primary) hypertension: Secondary | ICD-10-CM | POA: Insufficient documentation

## 2019-07-14 DIAGNOSIS — I4892 Unspecified atrial flutter: Secondary | ICD-10-CM | POA: Diagnosis not present

## 2019-07-14 DIAGNOSIS — Z9049 Acquired absence of other specified parts of digestive tract: Secondary | ICD-10-CM | POA: Diagnosis not present

## 2019-07-14 DIAGNOSIS — Z86711 Personal history of pulmonary embolism: Secondary | ICD-10-CM | POA: Diagnosis not present

## 2019-07-14 DIAGNOSIS — R7303 Prediabetes: Secondary | ICD-10-CM | POA: Diagnosis not present

## 2019-07-14 DIAGNOSIS — M7502 Adhesive capsulitis of left shoulder: Secondary | ICD-10-CM | POA: Insufficient documentation

## 2019-07-14 DIAGNOSIS — Z8673 Personal history of transient ischemic attack (TIA), and cerebral infarction without residual deficits: Secondary | ICD-10-CM | POA: Diagnosis not present

## 2019-07-14 DIAGNOSIS — M1990 Unspecified osteoarthritis, unspecified site: Secondary | ICD-10-CM | POA: Diagnosis not present

## 2019-07-14 DIAGNOSIS — Z79899 Other long term (current) drug therapy: Secondary | ICD-10-CM | POA: Insufficient documentation

## 2019-07-14 DIAGNOSIS — I4891 Unspecified atrial fibrillation: Secondary | ICD-10-CM | POA: Diagnosis not present

## 2019-07-14 DIAGNOSIS — K219 Gastro-esophageal reflux disease without esophagitis: Secondary | ICD-10-CM | POA: Insufficient documentation

## 2019-07-14 DIAGNOSIS — G4730 Sleep apnea, unspecified: Secondary | ICD-10-CM | POA: Diagnosis not present

## 2019-07-14 DIAGNOSIS — F319 Bipolar disorder, unspecified: Secondary | ICD-10-CM | POA: Diagnosis not present

## 2019-07-14 DIAGNOSIS — F419 Anxiety disorder, unspecified: Secondary | ICD-10-CM | POA: Insufficient documentation

## 2019-07-14 DIAGNOSIS — M25812 Other specified joint disorders, left shoulder: Secondary | ICD-10-CM | POA: Insufficient documentation

## 2019-07-14 DIAGNOSIS — Z01818 Encounter for other preprocedural examination: Secondary | ICD-10-CM | POA: Insufficient documentation

## 2019-07-14 HISTORY — DX: Cardiac arrhythmia, unspecified: I49.9

## 2019-07-14 LAB — CBC
HCT: 41 % (ref 36.0–46.0)
Hemoglobin: 13.3 g/dL (ref 12.0–15.0)
MCH: 30.6 pg (ref 26.0–34.0)
MCHC: 32.4 g/dL (ref 30.0–36.0)
MCV: 94.3 fL (ref 80.0–100.0)
Platelets: 246 10*3/uL (ref 150–400)
RBC: 4.35 MIL/uL (ref 3.87–5.11)
RDW: 12.4 % (ref 11.5–15.5)
WBC: 5.3 10*3/uL (ref 4.0–10.5)
nRBC: 0 % (ref 0.0–0.2)

## 2019-07-14 LAB — BASIC METABOLIC PANEL
Anion gap: 9 (ref 5–15)
BUN: 17 mg/dL (ref 6–20)
CO2: 22 mmol/L (ref 22–32)
Calcium: 9.1 mg/dL (ref 8.9–10.3)
Chloride: 107 mmol/L (ref 98–111)
Creatinine, Ser: 0.77 mg/dL (ref 0.44–1.00)
GFR calc Af Amer: >60 mL/min (ref >60)
GFR calc non Af Amer: >60 mL/min (ref >60)
Glucose, Bld: 108 mg/dL — ABNORMAL HIGH (ref 70–99)
Potassium: 3.9 mmol/L (ref 3.5–5.1)
Sodium: 138 mmol/L (ref 135–145)

## 2019-07-14 LAB — HEMOGLOBIN A1C
Hgb A1c MFr Bld: 5.7 % — ABNORMAL HIGH (ref 4.8–5.6)
Mean Plasma Glucose: 116.89 mg/dL

## 2019-07-14 NOTE — Progress Notes (Addendum)
PCP - dr. Lenna Sciara. [place] Cardiologist - Dr. Wyvonne Lenz  Chest x-ray -  EKG - 07/14/19 Stress Test - 06/29/19 ECHO - no Cardiac Cath - no  Sleep Study - yes CPAP - yes "uses it most of the time"  Fasting Blood Sugar - NA Checks Blood Sugar _____ times a day  Blood Thinner Instructions:Xarelto Aspirin Instructions:stop 3 days prior to DOS bridge with lovinox. Instructions given by Dr. Copper. Pt will see the cardiologist after shoulder surgery. Last Dose:07/16/19  Anesthesia review:   Patient denies shortness of breath, fever, cough and chest pain at PAT appointment yes  Patient verbalized understanding of instructions that were given to them at the PAT appointment. Patient was also instructed that they will need to review over the PAT instructions again at home before surgery. yes

## 2019-07-16 ENCOUNTER — Other Ambulatory Visit (HOSPITAL_COMMUNITY)
Admission: RE | Admit: 2019-07-16 | Discharge: 2019-07-16 | Disposition: A | Discharge status: Home / Self Care | Payer: 59 | Source: Ambulatory Visit | Attending: Orthopedic Surgery | Admitting: Orthopedic Surgery

## 2019-07-16 DIAGNOSIS — Z20828 Contact with and (suspected) exposure to other viral communicable diseases: Secondary | ICD-10-CM | POA: Insufficient documentation

## 2019-07-16 DIAGNOSIS — Z01812 Encounter for preprocedural laboratory examination: Secondary | ICD-10-CM | POA: Insufficient documentation

## 2019-07-16 LAB — SARS CORONAVIRUS 2 (TAT 6-24 HRS): SARS Coronavirus 2: NEGATIVE

## 2019-07-16 NOTE — Progress Notes (Signed)
Anesthesia Chart Review   Case: [id number] Date/Time: 07/19/19 1109   Procedure: SHOULDER ARTHROSCOPY WITH SUBACROMIAL DECOMPRESSION AND DISTAL CLAVICLE EXCISION (Left Shoulder) - 60 mins needed for length of case   Anesthesia type: Choice   Pre-op diagnosis:      Left Shoulder Impingement Syndrome      Adhesive Capsulitis Left Shoulder   Location[facility] ROOM 06 / [facility]   Surgeon: Vickey Huger, MD      DISCUSSION:53 y.o. never smoker with h/o bipolar disorder, HTN, HLD, Factor V Leiden, atrial fibrillation/atrial flutter (on Xarelto, planned ablation after shoulder surgery, EKG normal at PAT visit), sleep apnea w/cpap, Stroke, DVT, PE 03/2018, GERD, left shoulder impingement, adhesive capsulitis, scheduled for above procedure 07/19/2019 with Dr. Vickey Huger.   Seen by electrophysiologist, Dr. Johnathan Hausen, 05/28/2019 in regards to atrial flutter.  Per Dr. Johnathan Hausen, "No contraindication to shoulder surgery in regarding her atrial flutter; will continue diltiazem."  Ablation planned for after shoulder surgery.   Pt seen by cardiologist, Dr. Roselyn Bering, 06/10/2019 for preoperative evaluation. Stress test ordered with no abnormal findings (outlined below).  Per Dr. Ermalene Searing, " Please note patient is cleared from a cardiac standpoint."  Clearance on chart.    Pt has been given instruction to stop Xarelto and start Lovenox bridge prior to procedure.  Reports last dose of Xarelto will be 07/16/2019. VS: BP 127/88   Temp 36.8 C   Resp 20   Ht 5\' 7"  (1.702 m)   Wt 113.9 kg   LMP 06/21/2019   SpO2 99%   BMI 39.31 kg/m   PROVIDERS: [place], [place], PA-C is PCP   Roselyn Bering, MD is Cardiologist  LABS: Labs reviewed: Acceptable for surgery. (all labs ordered are listed, but only abnormal results are displayed)  Labs Reviewed  BASIC METABOLIC PANEL - Abnormal; Notable for the following components:      Result Value   Glucose, Bld 108 (*)    All other components within normal limits   HEMOGLOBIN A1C - Abnormal; Notable for the following components:   Hgb A1c MFr Bld 5.7 (*)    All other components within normal limits  CBC     IMAGES:   EKG: 07/14/2019 Rate 72 bpm Normal sinus rhythm  Within normal limits  CV: Stress Test 06/24/2019  FINDINGS:  Unexpected: None.  Perfusion defects: No reversible or fixed perfusion defects. Soft tissue attenuation artifact of the anterior wall.  TID ratio: <1.25  Ejection fraction: greater than 70%.  Wall motion abnormalities: None.  Echo 04/13/2019 SUMMARY The left ventricular size is normal. Mild left ventricular hypertrophy. Left  ventricular systolic function is normal. LV ejection fraction = 60-65%. The right ventricle is normal in size and function. Injection of agitated saline showed no right-to-left shunt. There is no pericardial effusion. There is no significant valvular stenosis or regurgitation. There is no significant change in comparison with the last study. Past Medical History:  Diagnosis Date  . Anxiety   . Behcet's disease ([place])   . Bipolar disorder ([place])   . Dependent edema    Legs and feet  . Depression   . DVT (deep venous thrombosis) ([place]) 03/2018   Left leg  . Dyspnea    with exertion  . Dysrhythmia   . Factor V Leiden mutation ([place])   . GERD (gastroesophageal reflux disease)   . Headache    Generalized  . Hearing loss   . Heart palpitations   . Heavy menstrual bleeding   . History of  concussion   . History of dizziness   . History of gallstones   . Hyperlipidemia   . Hypertension   . Insomnia   . Irregular uterine bleeding   . Night sweats   . OA (osteoarthritis)   . PE (pulmonary thromboembolism) ([facility]) 04/10/2018  . Pre-diabetes   . PTSD (post-traumatic stress disorder)   . Ruptured ear drum, left   . Sinus tachycardia   . Stroke [facility]) 2002   balance and short term memory issues   . TMJ (dislocation of temporomandibular joint)   . Trouble swallowing   . Urine  incontinence   . Venous insufficiency   . Wears glasses     Past Surgical History:  Procedure Laterality Date  . CESAREAN SECTION    . CHOLECYSTECTOMY    . EXAM UNDER ANESTHESIA WITH MANIPULATION OF SHOULDER Right 08/10/2018   Procedure: EXAM UNDER ANESTHESIA WITH MANIPULATION OF SHOULDER;  Surgeon: Dannielle Huh, MD;  Location: [facility];  Service: Orthopedics;  Laterality: Right;  . EXPLORATORY LAPAROTOMY  1990's    MEDICATIONS: . ferrous sulfate 325 (65 FE) MG tablet  . gabapentin (NEURONTIN) 300 MG capsule  . lansoprazole (PREVACID) 30 MG capsule  . Melatonin 3 MG TBDP  . mirabegron ER (MYRBETRIQ) 50 MG TB24 tablet  . OVER THE COUNTER MEDICATION  . oxyCODONE (OXY IR/ROXICODONE) 5 MG immediate release tablet  . pravastatin (PRAVACHOL) 40 MG tablet  . rivaroxaban (XARELTO) 20 MG TABS tablet  . TIADYLT ER 120 MG 24 hr capsule  . ziprasidone (GEODON) 40 MG capsule   No current facility-administered medications for this encounter.      Janey Genta [facility] Pre-Surgical Testing [phone] 07/16/19  1:14 PM

## 2019-07-16 NOTE — Anesthesia Preprocedure Evaluation (Addendum)
Anesthesia Evaluation  Patient identified by MRN, date of birth, ID band Patient awake    Reviewed: Allergy & Precautions, NPO status , Patient's Chart, lab work & pertinent test results  History of Anesthesia Complications Negative for: history of anesthetic complications  Airway Mallampati: III  TM Distance: >3 FB Neck ROM: Full    Dental  (+) Dental Advisory Given   Pulmonary sleep apnea and Continuous Positive Airway Pressure Ventilation , PE   Pulmonary exam normal        Cardiovascular hypertension (no longer on meds), (-) angina+ DVT  + dysrhythmias Atrial Fibrillation  Rhythm:Irregular Rate:Normal   Behcet's disease (vaso-inflammatory syndrome)  Seen by electrophysiologist, Dr. Johnathan Hausen, 05/28/2019 in regards to atrial flutter.  Per Dr. Johnathan Hausen, "No contraindication to shoulder surgery in regarding her atrial flutter; will continue diltiazem."  Ablation planned for after shoulder surgery.  Pt seen by cardiologist, Dr. Roselyn Bering, 06/10/2019 for preoperative evaluation. Stress test ordered with no abnormal findings.  Per Dr. Ermalene Searing, " Please note patient is cleared from a cardiac standpoint."      Neuro/Psych  Headaches, PSYCHIATRIC DISORDERS Anxiety Depression Bipolar Disorder  PTSD CVA (memory issues), Residual Symptoms    GI/Hepatic Neg liver ROS, GERD  Medicated and Controlled,  Endo/Other  Morbid obesity Pre-DM   Renal/GU negative Renal ROS     Musculoskeletal  (+) Arthritis , Osteoarthritis,   TMJ dysfunction    Abdominal (+) + obese,   Peds  Hematology  Factor V Leiden mutation Plt 246k Last xarelto dose 11/26 Lovenox bridge    Anesthesia Other Findings Covid negative 11/27   Reproductive/Obstetrics                          Anesthesia Physical Anesthesia Plan  ASA: III  Anesthesia Plan: General   Post-op Pain Management:  Regional for Post-op pain    Induction: Intravenous  PONV Risk Score and Plan: 3 and Treatment may vary due to age or medical condition, Ondansetron, Dexamethasone and Midazolam  Airway Management Planned: Oral ETT and Video Laryngoscope Planned  Additional Equipment: None  Intra-op Plan:   Post-operative Plan: Extubation in OR  Informed Consent: I have reviewed the patients History and Physical, chart, labs and discussed the procedure including the risks, benefits and alternatives for the proposed anesthesia with the patient or authorized representative who has indicated his/her understanding and acceptance.     Dental advisory given  Plan Discussed with: CRNA and Anesthesiologist  Anesthesia Plan Comments:       Anesthesia Quick Evaluation

## 2019-07-19 ENCOUNTER — Other Ambulatory Visit: Payer: Self-pay

## 2019-07-19 ENCOUNTER — Encounter (HOSPITAL_COMMUNITY): Payer: Self-pay | Admitting: Emergency Medicine

## 2019-07-19 ENCOUNTER — Inpatient Hospital Stay (HOSPITAL_COMMUNITY)
Admission: RE | Admit: 2019-07-19 | Discharge: 2019-07-19 | DRG: 501 | Disposition: A | Discharge status: Home / Self Care | Payer: 59 | Attending: Orthopedic Surgery | Admitting: Orthopedic Surgery

## 2019-07-19 ENCOUNTER — Telehealth (HOSPITAL_COMMUNITY): Payer: Self-pay | Admitting: *Deleted

## 2019-07-19 ENCOUNTER — Inpatient Hospital Stay (HOSPITAL_COMMUNITY): Payer: 59 | Admitting: Anesthesiology

## 2019-07-19 ENCOUNTER — Encounter (HOSPITAL_COMMUNITY)
Admission: RE | Disposition: A | Discharge status: Home / Self Care | Payer: Self-pay | Source: Home / Self Care | Attending: Orthopedic Surgery

## 2019-07-19 DIAGNOSIS — Z86718 Personal history of other venous thrombosis and embolism: Secondary | ICD-10-CM | POA: Diagnosis not present

## 2019-07-19 DIAGNOSIS — H9190 Unspecified hearing loss, unspecified ear: Secondary | ICD-10-CM | POA: Diagnosis present

## 2019-07-19 DIAGNOSIS — F419 Anxiety disorder, unspecified: Secondary | ICD-10-CM | POA: Diagnosis present

## 2019-07-19 DIAGNOSIS — M7542 Impingement syndrome of left shoulder: Principal | ICD-10-CM | POA: Diagnosis present

## 2019-07-19 DIAGNOSIS — F4310 Post-traumatic stress disorder, unspecified: Secondary | ICD-10-CM | POA: Diagnosis present

## 2019-07-19 DIAGNOSIS — Z8673 Personal history of transient ischemic attack (TIA), and cerebral infarction without residual deficits: Secondary | ICD-10-CM | POA: Diagnosis not present

## 2019-07-19 DIAGNOSIS — Z87820 Personal history of traumatic brain injury: Secondary | ICD-10-CM | POA: Diagnosis not present

## 2019-07-19 DIAGNOSIS — E785 Hyperlipidemia, unspecified: Secondary | ICD-10-CM | POA: Diagnosis present

## 2019-07-19 DIAGNOSIS — D6851 Activated protein C resistance: Secondary | ICD-10-CM | POA: Diagnosis present

## 2019-07-19 DIAGNOSIS — Z20828 Contact with and (suspected) exposure to other viral communicable diseases: Secondary | ICD-10-CM | POA: Diagnosis present

## 2019-07-19 DIAGNOSIS — K219 Gastro-esophageal reflux disease without esophagitis: Secondary | ICD-10-CM | POA: Diagnosis present

## 2019-07-19 DIAGNOSIS — M25812 Other specified joint disorders, left shoulder: Secondary | ICD-10-CM | POA: Diagnosis present

## 2019-07-19 DIAGNOSIS — M75112 Incomplete rotator cuff tear or rupture of left shoulder, not specified as traumatic: Secondary | ICD-10-CM | POA: Diagnosis present

## 2019-07-19 DIAGNOSIS — F319 Bipolar disorder, unspecified: Secondary | ICD-10-CM | POA: Diagnosis present

## 2019-07-19 DIAGNOSIS — Z86711 Personal history of pulmonary embolism: Secondary | ICD-10-CM

## 2019-07-19 DIAGNOSIS — I10 Essential (primary) hypertension: Secondary | ICD-10-CM | POA: Diagnosis present

## 2019-07-19 DIAGNOSIS — M7502 Adhesive capsulitis of left shoulder: Secondary | ICD-10-CM | POA: Diagnosis present

## 2019-07-19 DIAGNOSIS — M1990 Unspecified osteoarthritis, unspecified site: Secondary | ICD-10-CM | POA: Diagnosis present

## 2019-07-19 LAB — PREGNANCY, URINE: Preg Test, Ur: NEGATIVE

## 2019-07-19 SURGERY — SHOULDER ARTHROSCOPY WITH SUBACROMIAL DECOMPRESSION AND DISTAL CLAVICLE EXCISION
Anesthesia: General | Site: Shoulder | Laterality: Left

## 2019-07-19 MED ORDER — DEXAMETHASONE SODIUM PHOSPHATE 10 MG/ML IJ SOLN
INTRAMUSCULAR | Status: DC | PRN
  Administered 2019-07-19: 8 mg via INTRAVENOUS

## 2019-07-19 MED ORDER — OXYCODONE HCL 5 MG/5ML PO SOLN: 5.0000 mg | Freq: Once | ORAL | Status: DC | PRN

## 2019-07-19 MED ORDER — OXYCODONE HCL 5 MG PO TABS: 5.0000 mg | ORAL_TABLET | Freq: Once | ORAL | Status: DC | PRN

## 2019-07-19 MED ORDER — EPHEDRINE SULFATE-NACL 50-0.9 MG/10ML-% IV SOSY
PREFILLED_SYRINGE | INTRAVENOUS | Status: DC | PRN
  Administered 2019-07-19: 5 mg via INTRAVENOUS

## 2019-07-19 MED ORDER — DEXAMETHASONE SODIUM PHOSPHATE 10 MG/ML IJ SOLN
INTRAMUSCULAR | Status: AC
  Filled 2019-07-19: qty 1

## 2019-07-19 MED ORDER — LIDOCAINE 2% (20 MG/ML) 5 ML SYRINGE
INTRAMUSCULAR | Status: AC
  Filled 2019-07-19: qty 5

## 2019-07-19 MED ORDER — PROPOFOL 10 MG/ML IV BOLUS
INTRAVENOUS | Status: DC | PRN
  Administered 2019-07-19: 160 mg via INTRAVENOUS

## 2019-07-19 MED ORDER — ONDANSETRON HCL 4 MG/2ML IJ SOLN
INTRAMUSCULAR | Status: AC
  Filled 2019-07-19: qty 2

## 2019-07-19 MED ORDER — SUGAMMADEX SODIUM 500 MG/5ML IV SOLN
INTRAVENOUS | Status: AC
  Filled 2019-07-19: qty 5

## 2019-07-19 MED ORDER — CHLORHEXIDINE GLUCONATE 4 % EX LIQD: 60.0000 mL | Freq: Once | CUTANEOUS | Status: DC

## 2019-07-19 MED ORDER — LIDOCAINE 2% (20 MG/ML) 5 ML SYRINGE
INTRAMUSCULAR | Status: DC | PRN
  Administered 2019-07-19: 60 mg via INTRAVENOUS

## 2019-07-19 MED ORDER — EPHEDRINE 5 MG/ML INJ
INTRAVENOUS | Status: AC
  Filled 2019-07-19: qty 10

## 2019-07-19 MED ORDER — BUPIVACAINE LIPOSOME 1.3 % IJ SUSP
INTRAMUSCULAR | Status: DC | PRN
  Administered 2019-07-19: 10 mL via PERINEURAL

## 2019-07-19 MED ORDER — FENTANYL CITRATE (PF) 100 MCG/2ML IJ SOLN
50.0000 ug | INTRAMUSCULAR | Status: DC
  Administered 2019-07-19: 50 ug via INTRAVENOUS
  Filled 2019-07-19: qty 2

## 2019-07-19 MED ORDER — SODIUM CHLORIDE 0.9 % IR SOLN
Status: DC | PRN
  Administered 2019-07-19: 1000 mL

## 2019-07-19 MED ORDER — BUPIVACAINE HCL (PF) 0.5 % IJ SOLN
INTRAMUSCULAR | Status: DC | PRN
  Administered 2019-07-19: 15 mL via PERINEURAL

## 2019-07-19 MED ORDER — FENTANYL CITRATE (PF) 100 MCG/2ML IJ SOLN
INTRAMUSCULAR | Status: AC
  Filled 2019-07-19: qty 2

## 2019-07-19 MED ORDER — PROPOFOL 10 MG/ML IV BOLUS
INTRAVENOUS | Status: AC
  Filled 2019-07-19: qty 20

## 2019-07-19 MED ORDER — SUCCINYLCHOLINE CHLORIDE 200 MG/10ML IV SOSY
PREFILLED_SYRINGE | INTRAVENOUS | Status: DC | PRN
  Administered 2019-07-19: 120 mg via INTRAVENOUS

## 2019-07-19 MED ORDER — ROCURONIUM BROMIDE 10 MG/ML (PF) SYRINGE
PREFILLED_SYRINGE | INTRAVENOUS | Status: AC
  Filled 2019-07-19: qty 10

## 2019-07-19 MED ORDER — MIDAZOLAM HCL 2 MG/2ML IJ SOLN
1.0000 mg | INTRAMUSCULAR | Status: DC
  Administered 2019-07-19: 10:00:00 2 mg via INTRAVENOUS
  Filled 2019-07-19: qty 2

## 2019-07-19 MED ORDER — ONDANSETRON HCL 4 MG/2ML IJ SOLN
INTRAMUSCULAR | Status: DC | PRN
  Administered 2019-07-19: 4 mg via INTRAVENOUS

## 2019-07-19 MED ORDER — ROCURONIUM BROMIDE 10 MG/ML (PF) SYRINGE
PREFILLED_SYRINGE | INTRAVENOUS | Status: DC | PRN
  Administered 2019-07-19: 30 mg via INTRAVENOUS

## 2019-07-19 MED ORDER — OXYCODONE HCL 5 MG PO TABS: 5.0000 mg | ORAL_TABLET | Freq: Four times a day (QID) | ORAL | 0 refills | Status: AC | PRN

## 2019-07-19 MED ORDER — LACTATED RINGERS IV SOLN
INTRAVENOUS | Status: DC
  Administered 2019-07-19: 09:00:00 via INTRAVENOUS

## 2019-07-19 MED ORDER — PROMETHAZINE HCL 25 MG/ML IJ SOLN: 6.2500 mg | INTRAMUSCULAR | Status: DC | PRN

## 2019-07-19 MED ORDER — FENTANYL CITRATE (PF) 100 MCG/2ML IJ SOLN: 25.0000 ug | INTRAMUSCULAR | Status: DC | PRN

## 2019-07-19 MED ORDER — FENTANYL CITRATE (PF) 100 MCG/2ML IJ SOLN
INTRAMUSCULAR | Status: DC | PRN
  Administered 2019-07-19: 100 ug via INTRAVENOUS

## 2019-07-19 MED ORDER — LACTATED RINGERS IR SOLN
Status: DC | PRN
  Administered 2019-07-19: 3000 mL

## 2019-07-19 MED ORDER — SUGAMMADEX SODIUM 200 MG/2ML IV SOLN
INTRAVENOUS | Status: DC | PRN
  Administered 2019-07-19: 230 mg via INTRAVENOUS

## 2019-07-19 MED ORDER — CEFAZOLIN SODIUM-DEXTROSE 2-4 GM/100ML-% IV SOLN
2.0000 g | INTRAVENOUS | Status: AC
  Administered 2019-07-19: 2 g via INTRAVENOUS
  Filled 2019-07-19: qty 100

## 2019-07-19 SURGICAL SUPPLY — 80 items
AID PSTN UNV HD RSTRNT DISP (MISCELLANEOUS) ×1
BLADE AVERAGE 25MMX9MM (BLADE)
BLADE AVERAGE 25X9 (BLADE) IMPLANT
BLADE EXCALIBUR 4.0MM X 13CM (MISCELLANEOUS) ×1
BLADE EXCALIBUR 4.0X13 (MISCELLANEOUS) ×2 IMPLANT
BLADE SURG 15 STRL LF DISP TIS (BLADE) IMPLANT
BLADE SURG 15 STRL SS (BLADE)
BLADE SURG SZ10 CARB STEEL (BLADE) ×3 IMPLANT
BUR OVAL 4.0 (BURR) ×3 IMPLANT
BURR OVAL 8 FLU 4.0MM X 13CM (MISCELLANEOUS) ×1
BURR OVAL 8 FLU 4.0X13 (MISCELLANEOUS) ×2 IMPLANT
CANNULA 5.75X7 CRYSTAL CLEAR (CANNULA) ×3 IMPLANT
CANNULA SHOULDER 7CM (CANNULA) ×1 IMPLANT
CLEANER CAUTERY TIP 5X5 PAD (MISCELLANEOUS) IMPLANT
CLOSURE WOUND 1/2 X4 (GAUZE/BANDAGES/DRESSINGS)
COVER WAND RF STERILE (DRAPES) IMPLANT
DECANTER SPIKE VIAL GLASS SM (MISCELLANEOUS) IMPLANT
DISSECTOR  3.8MM X 13CM (MISCELLANEOUS)
DISSECTOR 3.8MM X 13CM (MISCELLANEOUS) IMPLANT
DRAPE IMP U-DRAPE 54X76 (DRAPES) ×4 IMPLANT
DRSG EMULSION OIL 3X3 NADH (GAUZE/BANDAGES/DRESSINGS) IMPLANT
DRSG PAD ABDOMINAL 8X10 ST (GAUZE/BANDAGES/DRESSINGS) ×4 IMPLANT
DURAPREP 26ML APPLICATOR (WOUND CARE) ×3 IMPLANT
ELECT MENISCUS 165MM 90D (ELECTRODE) IMPLANT
ELECT NDL TIP 2.8 STRL (NEEDLE) IMPLANT
ELECT NEEDLE TIP 2.8 STRL (NEEDLE) IMPLANT
ELECT REM PT RETURN 9FT ADLT (ELECTROSURGICAL) ×3
ELECTRODE REM PT RTRN 9FT ADLT (ELECTROSURGICAL) ×1 IMPLANT
EXCALIBUR 3.8MM X 13CM (MISCELLANEOUS) ×1 IMPLANT
GAUZE SPONGE 4X4 12PLY STRL (GAUZE/BANDAGES/DRESSINGS) ×3 IMPLANT
GAUZE XEROFORM 1X8 LF (GAUZE/BANDAGES/DRESSINGS) ×3 IMPLANT
GLOVE BIOGEL PI IND STRL 8.5 (GLOVE) ×2 IMPLANT
GLOVE BIOGEL PI INDICATOR 8.5 (GLOVE) ×4
GLOVE SURG ORTHO 8.0 STRL STRW (GLOVE) ×6 IMPLANT
GOWN STRL REUS W/ TWL LRG LVL3 (GOWN DISPOSABLE) ×1 IMPLANT
GOWN STRL REUS W/ TWL XL LVL3 (GOWN DISPOSABLE) ×2 IMPLANT
GOWN STRL REUS W/TWL LRG LVL3 (GOWN DISPOSABLE) ×3
GOWN STRL REUS W/TWL XL LVL3 (GOWN DISPOSABLE) ×6
KIT BASIN OR (CUSTOM PROCEDURE TRAY) ×3 IMPLANT
MANIFOLD NEPTUNE II (INSTRUMENTS) ×3 IMPLANT
NDL SCORPION MULTI FIRE (NEEDLE) IMPLANT
NDL SUT 6 .5 CRC .975X.05 MAYO (NEEDLE) IMPLANT
NEEDLE MAYO TAPER (NEEDLE)
NEEDLE SCORPION MULTI FIRE (NEEDLE) IMPLANT
NS IRRIG 1000ML POUR BTL (IV SOLUTION) IMPLANT
PACK ARTHROSCOPY DSU (CUSTOM PROCEDURE TRAY) ×3 IMPLANT
PAD CLEANER CAUTERY TIP 5X5 (MISCELLANEOUS)
PENCIL SMOKE EVACUATOR (MISCELLANEOUS) IMPLANT
PORT APPOLLO RF 90DEGREE MULTI (SURGICAL WAND) ×3 IMPLANT
PROBE BIPOLAR ARTHRO 85MM 30D (MISCELLANEOUS) IMPLANT
RESTRAINT HEAD UNIVERSAL NS (MISCELLANEOUS) ×3 IMPLANT
SLEEVE SCD COMPRESS KNEE MED (MISCELLANEOUS) ×3 IMPLANT
SLING ARM FOAM STRAP LRG (SOFTGOODS) IMPLANT
SLING ARM FOAM STRAP XLG (SOFTGOODS) ×2 IMPLANT
SPONGE LAP 4X18 RFD (DISPOSABLE) IMPLANT
STRIP CLOSURE SKIN 1/2X4 (GAUZE/BANDAGES/DRESSINGS) IMPLANT
SUCTION FRAZIER HANDLE 10FR (MISCELLANEOUS)
SUCTION TUBE FRAZIER 10FR DISP (MISCELLANEOUS) IMPLANT
SUT ETHILON 4 0 PS 2 18 (SUTURE) ×3 IMPLANT
SUT FIBERWIRE #2 38 T-5 BLUE (SUTURE)
SUT TIGER TAPE 7 IN WHITE (SUTURE) IMPLANT
SUT VIC AB 0 CT1 18XCR BRD 8 (SUTURE) IMPLANT
SUT VIC AB 0 CT1 27 (SUTURE)
SUT VIC AB 0 CT1 27XBRD ANBCTR (SUTURE) IMPLANT
SUT VIC AB 0 CT1 8-18 (SUTURE)
SUT VIC AB 2-0 SH 27 (SUTURE)
SUT VIC AB 2-0 SH 27XBRD (SUTURE) IMPLANT
SUT VIC AB 3-0 FS2 27 (SUTURE) IMPLANT
SUTURE FIBERWR #2 38 T-5 BLUE (SUTURE) IMPLANT
SYR 50ML LL SCALE MARK (SYRINGE) ×3 IMPLANT
SYR BULB 3OZ (MISCELLANEOUS) IMPLANT
TAPE FIBER 2MM 7IN #2 BLUE (SUTURE) IMPLANT
TOWEL OR 17X26 10 PK STRL BLUE (TOWEL DISPOSABLE) ×3 IMPLANT
TUBING ARTHROSCOPY IRRIG 16FT (MISCELLANEOUS) ×3 IMPLANT
TUBING CONNECTING 10 (TUBING) ×2 IMPLANT
TUBING CONNECTING 10' (TUBING) ×1
UNDERPAD 30X36 HEAVY ABSORB (UNDERPADS AND DIAPERS) ×3 IMPLANT
WATER STERILE IRR 1000ML POUR (IV SOLUTION) ×3 IMPLANT
YANKAUER SUCT BULB TIP NO VENT (SUCTIONS) IMPLANT
oval burr ×3 IMPLANT

## 2019-07-19 NOTE — Anesthesia Postprocedure Evaluation (Signed)
Anesthesia Post Note  Patient: Cathy Murray  Procedure(s) Performed: SHOULDER ARTHROSCOPY WITH SUBACROMIAL DECOMPRESSION AND DISTAL CLAVICLE EXCISION (Left Shoulder)     Patient location during evaluation: PACU Anesthesia Type: General Level of consciousness: awake and alert Pain management: pain level controlled Vital Signs Assessment: post-procedure vital signs reviewed and stable Respiratory status: spontaneous breathing, nonlabored ventilation and respiratory function stable Cardiovascular status: blood pressure returned to baseline and stable Postop Assessment: no apparent nausea or vomiting Anesthetic complications: no    Last Vitals:  Vitals:   07/19/19 1215 07/19/19 1230  BP: 131/80 121/77  Pulse: 92 81  Resp: 18 (!) 21  Temp:  (!) 36.3 C  SpO2: 94% 93%    Last Pain:  Vitals:   07/19/19 1230  TempSrc:   PainSc: 0-No pain                 Audry Pili

## 2019-07-19 NOTE — Anesthesia Procedure Notes (Signed)
Procedure Name: Intubation Date/Time: 07/19/2019 10:58 AM Performed by: Audry Pili, MD Pre-anesthesia Checklist: Patient identified, Emergency Drugs available, Suction available and Patient being monitored Patient Re-evaluated:Patient Re-evaluated prior to induction Oxygen Delivery Method: Circle system utilized Preoxygenation: Pre-oxygenation with 100% oxygen Induction Type: IV induction Ventilation: Mask ventilation without difficulty Laryngoscope Size: Glidescope and 3 Grade View: Grade I Tube type: Oral Tube size: 7.0 mm Number of attempts: 2 (DL with Mac 3 by CRNA, grade 3 view. Glidescope 3 with grade 1 view, ETT passed easily with glidescope stylet) Airway Equipment and Method: Rigid stylet and Video-laryngoscopy Placement Confirmation: ETT inserted through vocal cords under direct vision,  positive ETCO2 and breath sounds checked- equal and bilateral Secured at: 23 cm Tube secured with: Tape Dental Injury: Teeth and Oropharynx as per pre-operative assessment  Difficulty Due To: Difficulty was anticipated, Difficult Airway- due to anterior larynx and Difficult Airway- due to limited oral opening

## 2019-07-19 NOTE — Progress Notes (Signed)
AssistedDr. Brock with left, ultrasound guided, interscalene  block. Side rails up, monitors on throughout procedure. See vital signs in flow sheet. Tolerated Procedure well.  

## 2019-07-19 NOTE — H&P (Signed)
Cathy MinersLeslie C Murray MRN:  [id number] DOB/SEX:  July 06, 1966/female  CHIEF COMPLAINT:  Painful left shoulder  HISTORY: Patient is a 53 y.o. female presented with a history of pain in the left shoulder. Onset of symptoms was gradual starting a few years ago with gradually worsening course since that time. Patient has been treated conservatively with over-the-counter NSAIDs and activity modification. Patient currently rates pain in the shoulder at 10 out of 10 with activity. There is pain at night.  PAST MEDICAL HISTORY: There are no active problems to display for this patient.  Past Medical History:  Diagnosis Date  . Anxiety   . Behcet's disease (HCC)   . Bipolar disorder (HCC)   . Dependent edema    Legs and feet  . Depression   . DVT (deep venous thrombosis) (HCC) 03/2018   Left leg  . Dyspnea    with exertion  . Dysrhythmia   . Factor V Leiden mutation (HCC)   . GERD (gastroesophageal reflux disease)   . Headache    Generalized  . Hearing loss   . Heart palpitations   . Heavy menstrual bleeding   . History of concussion   . History of dizziness   . History of gallstones   . Hyperlipidemia   . Hypertension   . Insomnia   . Irregular uterine bleeding   . Night sweats   . OA (osteoarthritis)   . PE (pulmonary thromboembolism) (HCC) 04/10/2018  . Pre-diabetes   . PTSD (post-traumatic stress disorder)   . Ruptured ear drum, left   . Sinus tachycardia   . Stroke [facility](HCC) 2002   balance and short term memory issues   . TMJ (dislocation of temporomandibular joint)   . Trouble swallowing   . Urine incontinence   . Venous insufficiency   . Wears glasses    Past Surgical History:  Procedure Laterality Date  . CESAREAN SECTION    . CHOLECYSTECTOMY    . EXAM UNDER ANESTHESIA WITH MANIPULATION OF SHOULDER Right 08/10/2018   Procedure: EXAM UNDER ANESTHESIA WITH MANIPULATION OF SHOULDER;  Surgeon: Dannielle HuhLucey, Steve, MD;  Location: [facility];  Service: Orthopedics;  Laterality: Right;  .  EXPLORATORY LAPAROTOMY  1990's     MEDICATIONS:   Medications Prior to Admission  Medication Sig Dispense Refill Last Dose  . ferrous sulfate 325 (65 FE) MG tablet Take 325 mg by mouth daily with breakfast.   07/18/2019 at Unknown time  . lansoprazole (PREVACID) 30 MG capsule Take 30 mg by mouth daily.    07/18/2019 at Unknown time  . Melatonin 3 MG TBDP Take 1 tablet by mouth at bedtime.    07/18/2019 at Unknown time  . mirabegron ER (MYRBETRIQ) 50 MG TB24 tablet Take 50 mg by mouth daily.   07/18/2019 at Unknown time  . OVER THE COUNTER MEDICATION Take 2 capsules by mouth daily. Amberen: menopause relief   07/18/2019 at Unknown time  . pravastatin (PRAVACHOL) 40 MG tablet Take 40 mg by mouth daily.   07/18/2019 at Unknown time  . rivaroxaban (XARELTO) 20 MG TABS tablet Take 20 mg by mouth daily with supper.   07/15/2019  . TIADYLT ER 120 MG 24 hr capsule Take 120 mg by mouth daily.   07/19/2019 at 0720  . ziprasidone (GEODON) 40 MG capsule Take 40 mg by mouth at bedtime.    07/18/2019 at Unknown time  . gabapentin (NEURONTIN) 300 MG capsule Take 300 mg by mouth 3 (three) times daily as needed.   More than a  month at Unknown time  . oxyCODONE (OXY IR/ROXICODONE) 5 MG immediate release tablet Take 1 tablet (5 mg total) by mouth every 4 (four) hours as needed for severe pain. (Patient not taking: Reported on 07/08/2019) 30 tablet 0 Not Taking at Unknown time    ALLERGIES:   Allergies  Allergen Reactions  . Adhesive [Tape] Itching  . Other Other (See Comments)    Unknown antibiotic-Pt states she cannot remember name of it but it made her itch all over.  IV antibotic    REVIEW OF SYSTEMS:  A comprehensive review of systems was negative except for: Musculoskeletal: positive for arthralgias, bone pain and myalgias   FAMILY HISTORY:  History reviewed. No pertinent family history.  SOCIAL HISTORY:   Social History   Tobacco Use  . Smoking status: Never Smoker  . Smokeless tobacco:  Never Used  Substance Use Topics  . Alcohol use: Yes    Frequency: Never    Comment: rare     EXAMINATION:  Vital signs in last 24 hours: Temp:  [97.7 F (36.5 C)] 97.7 F (36.5 C) (11/30 0700) Pulse Rate:  [111] 111 (11/30 0700) Resp:  [18] 18 (11/30 0700) BP: (133)/(88) 133/88 (11/30 0700) SpO2:  [97 %] 97 % (11/30 0700) Weight:  [113.9 kg] 113.9 kg (11/30 0850)  BP 133/88 (BP Location: Left Arm)   Pulse (!) 111   Temp 97.7 F (36.5 C) (Oral)   Resp 18   Ht 5\' 7"  (1.702 m)   Wt 113.9 kg   LMP 06/21/2019 (Exact Date)   SpO2 97%   BMI 39.31 kg/m   General Appearance:    Alert, cooperative, no distress, appears stated age  Head:    Normocephalic, without obvious abnormality, atraumatic  Eyes:    PERRL, conjunctiva/corneas clear, EOM's intact, fundi    benign, both eyes  Ears:    Normal TM's and external ear canals, both ears  Nose:   Nares normal, septum midline, mucosa normal, no drainage    or sinus tenderness  Throat:   Lips, mucosa, and tongue normal; teeth and gums normal  Neck:   Supple, symmetrical, trachea midline, no adenopathy;    thyroid:  no enlargement/tenderness/nodules; no carotid   bruit or JVD  Back:     Symmetric, no curvature, ROM normal, no CVA tenderness  Lungs:     Clear to auscultation bilaterally, respirations unlabored  Chest Wall:    No tenderness or deformity   Heart:    Regular rate and rhythm, S1 and S2 normal, no murmur, rub   or gallop  Breast Exam:    No tenderness, masses, or nipple abnormality  Abdomen:     Soft, non-tender, bowel sounds active all four quadrants,    no masses, no organomegaly  Genitalia:    Normal female without lesion, discharge or tenderness  Rectal:    Normal tone, no masses or tenderness;   guaiac negative stool  Extremities:   Extremities normal, atraumatic, no cyanosis or edema  Pulses:   2+ and symmetric all extremities  Skin:   Skin color, texture, turgor normal, no rashes or lesions  Lymph nodes:    Cervical, supraclavicular, and axillary nodes normal  Neurologic:   CNII-XII intact, normal strength, sensation and reflexes    throughout    Musculoskeletal:  Left shoulder impingement signs and decreased strength and rom Imaging Review Type 2 acromion, ac joint degenerative changes, chronic rotator cuff tendonosis  Assessment/Plan: Impingement left shoulder - left shoulder arthroscopy, subacromial decompression,  distal clavicle resection  The patient history, physical examination and imaging studies are consistent with impingement syndrome of the left shoulder. The patient has failed conservative treatment.  The clearance notes were reviewed.  After discussion with the patient it was felt that shoulder arthroscopy was indicated. The procedure,  risks, and benefits of shoulder arthroscopy were presented and reviewed. The risks including but not limited to infection, blood clots, vascular injury, stiffness, complications among others were discussed. The patient acknowledged the explanation, agreed to proceed with the plan.  Guy Sandifer 07/19/2019, 8:59 AM

## 2019-07-19 NOTE — Anesthesia Procedure Notes (Signed)
Anesthesia Regional Block: Interscalene brachial plexus block   Pre-Anesthetic Checklist: ,, timeout performed, Correct Patient, Correct Site, Correct Laterality, Correct Procedure, Correct Position, site marked, Risks and benefits discussed,  Surgical consent,  Pre-op evaluation,  At surgeon's request and post-op pain management  Laterality: Left  Prep: chloraprep       Needles:  Injection technique: Single-shot  Needle Type: Echogenic Needle     Needle Length: 5cm  Needle Gauge: 21     Additional Needles:   Narrative:  Start time: 07/19/2019 9:57 AM End time: 07/19/2019 10:01 AM Injection made incrementally with aspirations every 5 mL.  Performed by: Personally  Anesthesiologist: Audry Pili, MD  Additional Notes: No pain on injection. No increased resistance to injection. Injection made in 5cc increments. Good needle visualization. Patient tolerated the procedure well.

## 2019-07-19 NOTE — Transfer of Care (Signed)
Immediate Anesthesia Transfer of Care Note  Patient: Cathy Murray  Procedure(s) Performed: SHOULDER ARTHROSCOPY WITH SUBACROMIAL DECOMPRESSION AND DISTAL CLAVICLE EXCISION (Left Shoulder)  Patient Location: PACU  Anesthesia Type:General  Level of Consciousness: awake, alert , oriented and patient cooperative  Airway & Oxygen Therapy: Patient Spontanous Breathing and Patient connected to face mask oxygen  Post-op Assessment: Report given to RN, Post -op Vital signs reviewed and stable and Patient moving all extremities  Post vital signs: Reviewed and stable  Last Vitals:  Vitals Value Taken Time  BP 136/77 07/19/19 1202  Temp    Pulse 93 07/19/19 1203  Resp 16 07/19/19 1203  SpO2 100 % 07/19/19 1203  Vitals shown include unvalidated device data.  Last Pain:  Vitals:   07/19/19 1030  TempSrc:   PainSc: 0-No pain      Patients Stated Pain Goal: 4 (43/27/61 4709)  Complications: No apparent anesthesia complications

## 2019-07-20 ENCOUNTER — Encounter (HOSPITAL_COMMUNITY): Payer: Self-pay | Admitting: Orthopedic Surgery

## 2019-07-20 NOTE — Discharge Summary (Signed)
SPORTS MEDICINE & JOINT REPLACEMENT   Cathy Spurling, MD   Cathy Nancy, PA-C [address] [place], [place], [place]  [address]                             [phone]  PATIENT ID: Cathy Murray        MRN:  [id number]          DOB/AGE: 12-29-1965 / 53 y.o.    DISCHARGE SUMMARY  ADMISSION DATE:    07/19/2019 DISCHARGE DATE:   07/19/2019  ADMISSION DIAGNOSIS: Left Shoulder Impingement Syndrome  Adhesive Capsulitis Left Shoulder    DISCHARGE DIAGNOSIS:  Left Shoulder Impingement Syndrome  Adhesive Capsulitis Left Shoulder    ADDITIONAL DIAGNOSIS: Active Problems:   * No active hospital problems. *  Past Medical History:  Diagnosis Date  . Anxiety   . Behcet's disease (HCC)   . Bipolar disorder (HCC)   . Dependent edema    Legs and feet  . Depression   . DVT (deep venous thrombosis) (HCC) 03/2018   Left leg  . Dyspnea    with exertion  . Dysrhythmia   . Factor V Leiden mutation (HCC)   . GERD (gastroesophageal reflux disease)   . Headache    Generalized  . Hearing loss   . Heart palpitations   . Heavy menstrual bleeding   . History of concussion   . History of dizziness   . History of gallstones   . Hyperlipidemia   . Hypertension   . Insomnia   . Irregular uterine bleeding   . Night sweats   . OA (osteoarthritis)   . PE (pulmonary thromboembolism) (HCC) 04/10/2018  . Pre-diabetes   . PTSD (post-traumatic stress disorder)   . Ruptured ear drum, left   . Sinus tachycardia   . Stroke [facility]) 2002   balance and short term memory issues   . TMJ (dislocation of temporomandibular joint)   . Trouble swallowing   . Urine incontinence   . Venous insufficiency   . Wears glasses     PROCEDURE: Procedure(s): SHOULDER ARTHROSCOPY WITH SUBACROMIAL DECOMPRESSION AND DISTAL CLAVICLE EXCISION on 07/19/2019  CONSULTS:    HISTORY:  See H&P in chart  HOSPITAL COURSE:  Cathy Murray is a 53 y.o. admitted on 07/19/2019 and found to have a diagnosis of Left Shoulder  Impingement Syndrome  Adhesive Capsulitis Left Shoulder.  After appropriate laboratory studies were obtained  they were taken to the operating room on 07/19/2019 and underwent Procedure(s): SHOULDER ARTHROSCOPY WITH SUBACROMIAL DECOMPRESSION AND DISTAL CLAVICLE EXCISION.   They were given perioperative antibiotics:  Anti-infectives (From admission, onward)   Start     Dose/Rate Route Frequency Ordered Stop   07/19/19 0845  ceFAZolin (ANCEF) IVPB 2g/100 mL premix     2 g 200 mL/hr over 30 Minutes Intravenous On call to O.R. 07/19/19 6546 07/19/19 1130    .  Patient given tranexamic acid IV or topical and exparel intra-operatively.  Tolerated the procedure well.    POD# 1: Vital signs were stable.  Patient denied Chest pain, shortness of breath, or calf pain.  Patient was started on Aspirin twice daily at 8am.  Consults to PT, OT, and care management were made.  The patient was weight bearing as tolerated.  CPM was placed on the operative leg 0-90 degrees for 6-8 hours a day. When out of the CPM, patient was placed in the foam block to achieve full extension.  Incentive spirometry was taught.  Dressing was changed.       POD #2, Continued  PT for ambulation and exercise program.  IV saline locked.  O2 discontinued.    The remainder of the hospital course was dedicated to ambulation and strengthening.   The patient was discharged on 1 Day Post-Op in  Good condition.  Blood products given:none  DIAGNOSTIC STUDIES: Recent vital signs:  Patient Vitals for the past 24 hrs:  BP Temp Pulse Resp SpO2 Height Weight  07/19/19 1253 119/82 - 84 18 94 % - -  07/19/19 1230 121/77 (!) 97.4 F (36.3 C) 81 (!) 21 93 % - -  07/19/19 1215 131/80 - 92 18 94 % - -  07/19/19 1203 136/77 97.8 F (36.6 C) 93 11 98 % - -  07/19/19 1030 133/82 - 77 (!) 26 99 % - -  07/19/19 1025 139/84 - 79 (!) 24 99 % - -  07/19/19 1020 132/86 - 84 (!) 21 99 % - -  07/19/19 1015 130/88 - 80 20 99 % - -  07/19/19 1010  132/87 - 83 13 98 % - -  07/19/19 1005 129/84 - 82 15 98 % - -  07/19/19 1000 136/87 - 81 18 99 % - -  07/19/19 0954 132/86 - 86 14 95 % - -  07/19/19 0906 - - 90 - - - -  07/19/19 0850 - - - - - 5\' 7"  (1.702 m) 113.9 kg       Recent laboratory studies: Recent Labs    07/14/19 1420  WBC 5.3  HGB 13.3  HCT 41.0  PLT 246   Recent Labs    07/14/19 1420  NA 138  K 3.9  CL 107  CO2 22  BUN 17  CREATININE 0.77  GLUCOSE 108*  CALCIUM 9.1   No results found for: INR, PROTIME   Recent Radiographic Studies :  No results found.  DISCHARGE INSTRUCTIONS: Discharge Instructions    Call MD / Call 911   Complete by: As directed    If you experience chest pain or shortness of breath, CALL 911 and be transported to the hospital emergency room.  If you develope a fever above 101 F, pus (white drainage) or increased drainage or redness at the wound, or calf pain, call your surgeon's office.   Constipation Prevention   Complete by: As directed    Drink plenty of fluids.  Prune juice may be helpful.  You may use a stool softener, such as Colace (over the counter) 100 mg twice a day.  Use MiraLax (over the counter) for constipation as needed.   Diet - low sodium heart healthy   Complete by: As directed    Discharge instructions   Complete by: As directed    Take all medications as prescribed Wear sling for one week Take medications as prescribed Follow up in office next week   Increase activity slowly as tolerated   Complete by: As directed       DISCHARGE MEDICATIONS:   Allergies as of 07/19/2019      Reactions   Adhesive [tape] Itching   Other Other (See Comments)   Unknown antibiotic-Pt states she cannot remember name of it but it made her itch all over.  IV antibotic      Medication List    TAKE these medications   ferrous sulfate 325 (65 FE) MG tablet Take 325 mg by mouth daily with breakfast.   gabapentin 300 MG  capsule Commonly known as: NEURONTIN Take 300 mg by  mouth 3 (three) times daily as needed.   lansoprazole 30 MG capsule Commonly known as: PREVACID Take 30 mg by mouth daily.   Melatonin 3 MG Tbdp Take 1 tablet by mouth at bedtime.   Myrbetriq 50 MG Tb24 tablet Generic drug: mirabegron ER Take 50 mg by mouth daily.   OVER THE COUNTER MEDICATION Take 2 capsules by mouth daily. Amberen: menopause relief   oxyCODONE 5 MG immediate release tablet Commonly known as: Oxy IR/ROXICODONE Take 1-2 tablets (5-10 mg total) by mouth every 6 (six) hours as needed for severe pain. What changed:   how much to take  when to take this   pravastatin 40 MG tablet Commonly known as: PRAVACHOL Take 40 mg by mouth daily.   rivaroxaban 20 MG Tabs tablet Commonly known as: XARELTO Take 20 mg by mouth daily with supper.   Tiadylt ER 120 MG 24 hr capsule Generic drug: diltiazem Take 120 mg by mouth daily.   ziprasidone 40 MG capsule Commonly known as: GEODON Take 40 mg by mouth at bedtime.       FOLLOW UP VISIT:    DISPOSITION: HOME VS. SNF  CONDITION:  Good   Cathy Murray 07/20/2019, 7:35 AM

## 2019-07-22 DIAGNOSIS — M7542 Impingement syndrome of left shoulder: Secondary | ICD-10-CM | POA: Diagnosis present

## 2019-07-22 DIAGNOSIS — M25812 Other specified joint disorders, left shoulder: Secondary | ICD-10-CM | POA: Diagnosis present

## 2019-07-22 NOTE — Op Note (Signed)
Preoperative diagnosis: Left shoulder subacromial impingement syndrome  Postoperative diagnosis: Same  Indication for procedure: Patient is a 53 year old white female with failure of conservative measures.  Informed consent was obtained.  Description of procedure:  Patient was taken to the operating room and administered general anesthesia.  A preoperative interscalene block was performed.  The patient was placed in the beachchair position and sterilely prepped and draped.  Anterior posterior and direct lateral portals were created with a #11 blade blunt trocar and cannula.  Diagnostic arthroscopy revealed no glenohumeral arthritis and slight labral tearing.  Mild debridement was carried out in the anterior aspect of the joint.  I then redirected the scope in the posterior portal into the subacromial space.  I performed a bursectomy through the direct lateral portal.  I then cleaned off the undersurface of the acromion and distal clavicle with the ArthroCare debridement wand.  I also released the CA ligament with the wand.  I then used the 4.0 mm cylindrical bur to perform an aggressive acromioplasty and distal clavicle resection removing approximately 10 mm of bone off the distal clavicle.  This afforded excellent decompression.  I then further debrided the rotator cuff with partial tearing but no evidence of full-thickness tearing.  Patient tolerated the procedure well I closed the portals with 4 nylon sutures dressed with Xeroform dressing sponges ABD sponges 2 inch silk tape a sling.  The patient was taken to recovery room in stable condition.  Complications none drains none.

## 2020-08-21 IMAGING — DX DG CHEST 1V PORT
1 series · 1 of 1 positions shown · non-contrast
Comparison: CT 04/10/2018.  MRI 01/27/2018.

CLINICAL DATA: Right shoulder surgery.  Shortness of breath.

EXAM:
PORTABLE CHEST 1 VIEW

[chest ap]
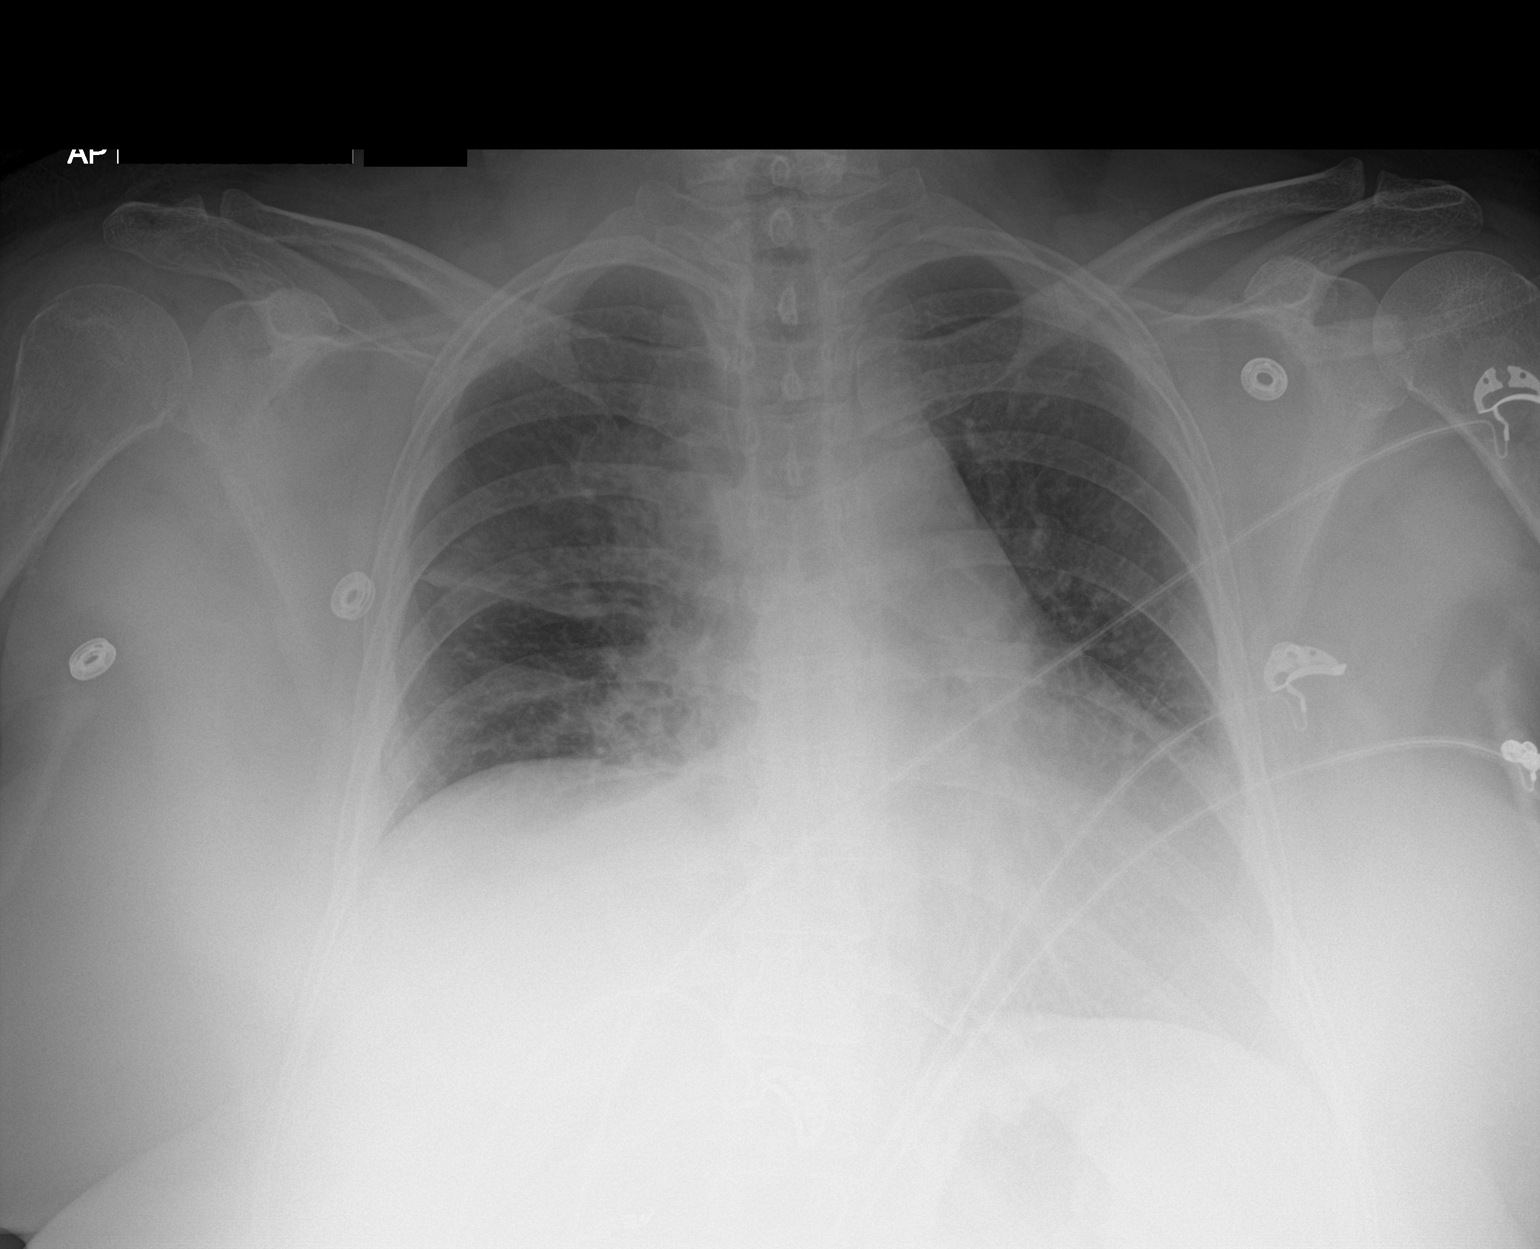

[1 of 1 positions shown; findings below may reference images not displayed]

FINDINGS: Cardiomegaly. Right upper and right lower lobe infiltrates. No
pleural effusion or pneumothorax.
IMPRESSION: 1.  Right upper and right lower lobe infiltrates.

2.  Cardiomegaly.
